# Patient Record
Sex: Female | Born: 1995 | Race: White | Hispanic: No | Marital: Single | Smoking: Never smoker
Health system: Southern US, Community
[De-identification: ages and names within clinical notes are randomized; demographics above are authoritative.]

## PROBLEM LIST (undated history)

## (undated) ENCOUNTER — Inpatient Hospital Stay (HOSPITAL_COMMUNITY): Payer: Self-pay

## (undated) DIAGNOSIS — F329 Major depressive disorder, single episode, unspecified: Secondary | ICD-10-CM

## (undated) DIAGNOSIS — F32A Depression, unspecified: Secondary | ICD-10-CM

## (undated) DIAGNOSIS — N390 Urinary tract infection, site not specified: Secondary | ICD-10-CM

---

## 2013-06-26 ENCOUNTER — Emergency Department (HOSPITAL_COMMUNITY)
Admission: EM | Admit: 2013-06-26 | Discharge: 2013-06-26 | Disposition: A | Discharge status: Home / Self Care | Payer: Managed Care, Other (non HMO) | Attending: Emergency Medicine | Admitting: Emergency Medicine

## 2013-06-26 ENCOUNTER — Encounter (HOSPITAL_COMMUNITY): Payer: Self-pay | Admitting: Nurse Practitioner

## 2013-06-26 ENCOUNTER — Emergency Department (HOSPITAL_COMMUNITY): Payer: Managed Care, Other (non HMO)

## 2013-06-26 DIAGNOSIS — Y9389 Activity, other specified: Secondary | ICD-10-CM | POA: Insufficient documentation

## 2013-06-26 DIAGNOSIS — X500XXA Overexertion from strenuous movement or load, initial encounter: Secondary | ICD-10-CM | POA: Insufficient documentation

## 2013-06-26 DIAGNOSIS — Y929 Unspecified place or not applicable: Secondary | ICD-10-CM | POA: Insufficient documentation

## 2013-06-26 DIAGNOSIS — S91309A Unspecified open wound, unspecified foot, initial encounter: Secondary | ICD-10-CM | POA: Insufficient documentation

## 2013-06-26 DIAGNOSIS — S99922A Unspecified injury of left foot, initial encounter: Secondary | ICD-10-CM

## 2013-06-26 NOTE — ED Provider Notes (Signed)
CSN: [id number]     Arrival date & time 06/26/13  1504 History  This chart was scribed for Kathleen Elders, NP working with Dagmar Hait, MD by Quintella Reichert, ED Scribe. This patient was seen in room TR11C/TR11C and the patient's care was started at 4:17 PM.  Chief Complaint  Patient presents with  . Foot Pain    The history is provided by the patient. No language interpreter was used.   HPI Comments: Kathleen Lloyd is a 17 y.o. female who presents to the Emergency Department complaining of a left ankle injury sustained last night.  Pt states that she was trying to help a friend get up last night and they fell onto her foot and twisted her foot outwards. Pt has sudden onset left foot pain that radiates up to her knee. She states bearing weight and movement worsen the pain. Pt has some numbness and tingling in her foot. She has taken ibuprofen and iced with mild relief. Pt denies any other associated symptoms.   No past medical history on file. No past surgical history on file. No family history on file.  History  Substance Use Topics  . Smoking status: Never Smoker   . Smokeless tobacco: Not on file  . Alcohol Use: No    OB History   Grav Para Term Preterm Abortions TAB SAB Ect Mult Living                  Review of Systems  Musculoskeletal: Positive for arthralgias.  Neurological: Positive for numbness.  All other systems reviewed and are negative.    Allergies  Review of patient's allergies indicates no known allergies.  Home Medications  No current outpatient prescriptions on file.  BP 118/71  Pulse 92  Temp(Src) 99 F (37.2 C) (Oral)  Resp 18  SpO2 100%  Physical Exam  Nursing note and vitals reviewed. Constitutional: She is oriented to person, place, and time. She appears well-developed and well-nourished. No distress.  HENT:  Head: Normocephalic and atraumatic.  Eyes: EOM are normal.  Neck: Neck supple. No tracheal deviation present.   Cardiovascular: Normal rate.   Capillary refill less than 3 seconds.  Pulmonary/Chest: Effort normal. No respiratory distress.  Musculoskeletal: Normal range of motion.  Tenderness to left medial malleolus that extends down into her foot. No deformity noted. No bruising noted.   Neurological: She is alert and oriented to person, place, and time.  Sensation intact.   Skin: Skin is warm and dry.  Psychiatric: She has a normal mood and affect. Her behavior is normal.    ED Course  Procedures (including critical care time)  DIAGNOSTIC STUDIES: Oxygen Saturation is 100% on room air, normal by my interpretation.    COORDINATION OF CARE: 4:20 PM-Discussed treatment plan which includes xray with pt at bedside and pt agreed to plan.   4:50 PM-Will give pt an ankle splint and advised her continue taking ibuprofen.   Labs Review Labs Reviewed - No data to display  Imaging Review Dg Foot Complete Left  06/26/2013   CLINICAL DATA:  Traumatic injury with pain  EXAM: LEFT FOOT - COMPLETE 3+ VIEW  COMPARISON:  None.  FINDINGS: There is no evidence of fracture or dislocation. There is no evidence of arthropathy or other focal bone abnormality. Soft tissues are unremarkable.  IMPRESSION: No acute abnormality noted.   Electronically Signed   By: Alcide Clever M.D.   On: 06/26/2013 16:33    MDM   1. Soft tissue  injury of foot, left, initial encounter     X-ray; no evidence of fracture or dislocation.  Soft tissue injury vs. Mild sprain. ASO splint for support. Ibuprofen 600-800mg  every 6 hours.   I personally performed the services described in this documentation, which was scribed in my presence. The recorded information has been reviewed and is accurate.       Kathleen Elders, NP 06/26/13 [phone]

## 2013-06-26 NOTE — ED Provider Notes (Signed)
Medical screening examination/treatment/procedure(s) were performed by non-physician practitioner and as supervising physician I was immediately available for consultation/collaboration.   Dagmar Hait, MD 06/26/13 [phone]

## 2013-06-26 NOTE — ED Notes (Signed)
Pt was trying to help friend get up last night and they fell onto her foot. C/o L foot pain since, from ankle radiating towards knee. Ambulatory.

## 2013-09-22 NOTE — L&D Delivery Note (Signed)
Delivery Note At 2:20 PM a viable female, "Herschel [place]", was delivered via Vaginal, Spontaneous Delivery (Presentation: Left Occiput Anterior).  APGAR: 5, 8; weight 7 lb 1.8 oz (3225 g).   Placenta status: Intact, Spontaneous.  Cord: 3 vessels with the following complications: .CAN x 2, body cord, occult prolapse.  Cord pH: arterial 7.4, venous 7.4 Depressed at delivery, Code Apgar called.  Baby responded quickly to stimulation and O2.  Cleared by NICU team to remain in room with mother.  Anesthesia: Epidural  Episiotomy: None Lacerations: Bilateral periurethral Suture Repair: 2.0 3.0 vicryl Est. Blood Loss (mL): 250 cc  Mom to postpartum.  Baby to Couplet care / Skin to Skin. Family plans outpatient circumcision.  Tarsha Blando, [place] 06/11/2014, 3:10 PM

## 2013-10-11 LAB — OB RESULTS CONSOLE HEPATITIS B SURFACE ANTIGEN: Hepatitis B Surface Ag: NEGATIVE

## 2013-10-11 LAB — OB RESULTS CONSOLE HIV ANTIBODY (ROUTINE TESTING): HIV: NONREACTIVE

## 2013-10-11 LAB — OB RESULTS CONSOLE ABO/RH: RH Type: POSITIVE

## 2013-10-11 LAB — OB RESULTS CONSOLE GC/CHLAMYDIA
CHLAMYDIA, DNA PROBE: NEGATIVE
GC PROBE AMP, GENITAL: NEGATIVE

## 2013-10-11 LAB — OB RESULTS CONSOLE RUBELLA ANTIBODY, IGM: RUBELLA: IMMUNE

## 2013-10-11 LAB — OB RESULTS CONSOLE ANTIBODY SCREEN: Antibody Screen: NEGATIVE

## 2013-10-11 LAB — OB RESULTS CONSOLE RPR: RPR: NONREACTIVE

## 2013-11-18 ENCOUNTER — Encounter (HOSPITAL_COMMUNITY): Payer: Self-pay | Admitting: Emergency Medicine

## 2013-11-18 ENCOUNTER — Emergency Department (HOSPITAL_COMMUNITY)
Admission: EM | Admit: 2013-11-18 | Discharge: 2013-11-18 | Discharge status: Against Medical Advice | Payer: Medicaid Other | Attending: Emergency Medicine | Admitting: Emergency Medicine

## 2013-11-18 DIAGNOSIS — K920 Hematemesis: Secondary | ICD-10-CM | POA: Insufficient documentation

## 2013-11-18 DIAGNOSIS — O9989 Other specified diseases and conditions complicating pregnancy, childbirth and the puerperium: Secondary | ICD-10-CM | POA: Insufficient documentation

## 2013-11-18 DIAGNOSIS — K921 Melena: Secondary | ICD-10-CM | POA: Insufficient documentation

## 2013-11-18 NOTE — ED Notes (Addendum)
Pt reports she is [redacted] weeks pregnant and has been throwing up blood and bile all day today. Unable to hold any fluids down. Denies abdominal pain. Reports bright red and dark red blood in commode.  Endorses throwing up a lot over the last few weeks, taking promethazine 25 mg without relief, not currently vomiting at this time.

## 2013-11-18 NOTE — ED Notes (Signed)
No answer when called 3 times.  

## 2014-01-24 ENCOUNTER — Encounter (HOSPITAL_COMMUNITY): Payer: Self-pay | Admitting: *Deleted

## 2014-01-24 ENCOUNTER — Inpatient Hospital Stay (HOSPITAL_COMMUNITY)
Admission: AD | Admit: 2014-01-24 | Discharge: 2014-01-24 | Disposition: A | Discharge status: Home / Self Care | Payer: Medicaid Other | Source: Ambulatory Visit | Attending: Obstetrics & Gynecology | Admitting: Obstetrics & Gynecology

## 2014-01-24 DIAGNOSIS — O99619 Diseases of the digestive system complicating pregnancy, unspecified trimester: Secondary | ICD-10-CM

## 2014-01-24 DIAGNOSIS — R0789 Other chest pain: Secondary | ICD-10-CM | POA: Insufficient documentation

## 2014-01-24 DIAGNOSIS — B3731 Acute candidiasis of vulva and vagina: Secondary | ICD-10-CM

## 2014-01-24 DIAGNOSIS — K219 Gastro-esophageal reflux disease without esophagitis: Secondary | ICD-10-CM

## 2014-01-24 DIAGNOSIS — R0602 Shortness of breath: Secondary | ICD-10-CM | POA: Insufficient documentation

## 2014-01-24 DIAGNOSIS — B373 Candidiasis of vulva and vagina: Secondary | ICD-10-CM

## 2014-01-24 DIAGNOSIS — O99891 Other specified diseases and conditions complicating pregnancy: Secondary | ICD-10-CM | POA: Insufficient documentation

## 2014-01-24 DIAGNOSIS — O9989 Other specified diseases and conditions complicating pregnancy, childbirth and the puerperium: Principal | ICD-10-CM

## 2014-01-24 HISTORY — DX: Urinary tract infection, site not specified: N39.0

## 2014-01-24 LAB — URINE MICROSCOPIC-ADD ON

## 2014-01-24 LAB — URINALYSIS, ROUTINE W REFLEX MICROSCOPIC
Bilirubin Urine: NEGATIVE
GLUCOSE, UA: NEGATIVE mg/dL
Hgb urine dipstick: NEGATIVE
KETONES UR: NEGATIVE mg/dL
Nitrite: NEGATIVE
PH: 6 (ref 5.0–8.0)
Protein, ur: NEGATIVE mg/dL
SPECIFIC GRAVITY, URINE: 1.015 (ref 1.005–1.030)
Urobilinogen, UA: 1 mg/dL (ref 0.0–1.0)

## 2014-01-24 LAB — WET PREP, GENITAL
CLUE CELLS WET PREP: NONE SEEN
TRICH WET PREP: NONE SEEN

## 2014-01-24 MED ORDER — FLUCONAZOLE 150 MG PO TABS
150.0000 mg | ORAL_TABLET | Freq: Once | ORAL | Status: AC
  Administered 2014-01-24: 150 mg via ORAL
  Filled 2014-01-24: qty 1

## 2014-01-24 MED ORDER — FAMOTIDINE 10 MG PO TABS: 10.0000 mg | ORAL_TABLET | Freq: Two times a day (BID) | ORAL | Status: DC

## 2014-01-24 MED ORDER — FLUCONAZOLE 150 MG PO TABS: 150.0000 mg | ORAL_TABLET | Freq: Once | ORAL | Status: DC

## 2014-01-24 MED ORDER — GI COCKTAIL ~~LOC~~
30.0000 mL | Freq: Once | ORAL | Status: AC
  Administered 2014-01-24: 30 mL via ORAL
  Filled 2014-01-24: qty 30

## 2014-01-24 NOTE — Progress Notes (Signed)
Release of information signed to get medical records from Dr. Cyndie Chime office. Given to L. Ronnald Ramp, NS to call and fax.

## 2014-01-24 NOTE — Discharge Instructions (Signed)
- We believe that your chest pain and shortness of breath was caused by your untreated Acid Reflux, as your symptoms responded to the GI Cocktail that you drank. Your EKG was normal. - Sent prescription for Pepcid 10mg  take twice daily with meals to your pharmacy. Continue taking this for the next 2 weeks. Discuss this at your first clinic appointment, they may change this at that time - Review the hand out and recommendations to reduce your symptoms - Your swab exam was consistent with a Yeast Infection, you received Diflucan 150mg  tablet for one dose here, and sent prescription for another identical dose (to be taken Friday 5/8, if your symptoms persist) - Your urine was concerning for a potential UTI as well. We have sent it for a culture, and will notify you with the results if we need to call in another antibiotic that will specifically treat this infection. Otherwise, check with the clinic about your results on this test.  - If you develop return of worsening chest pain / pressure, shortness of breath, HA that doesn't resolve, please seek immediate care by calling the clinic and coming back to the MAU for further evaluation.  Heartburn During Pregnancy  Heartburn is a burning sensation in the chest caused by stomach acid backing up into the esophagus. Heartburn is common in pregnancy because a certain hormone (progesterone) is released when a woman is pregnant. The progesterone hormone may relax the valve that separates the esophagus from the stomach. This allows acid to go up into the esophagus, causing heartburn. Heartburn may also happen in pregnancy because the enlarging uterus pushes up on the stomach, which pushes more acid into the esophagus. This is especially true in the later stages of pregnancy. Heartburn problems usually go away after giving birth. CAUSES  Heartburn is caused by stomach acid backing up into the esophagus. During pregnancy, this may result from various things, including:     The progesterone hormone.  Changing hormone levels.  The growing uterus pushing stomach acid upward.  Large meals.  Certain foods and drinks.  Exercise.  Increased acid production. SIGNS AND SYMPTOMS   Burning pain in the chest or lower throat.  Bitter taste in the mouth.  Coughing. DIAGNOSIS  Your health care provider will typically diagnose heartburn by taking a careful history of your concern. Blood tests may be done to check for a certain type of bacteria that is associated with heartburn. Sometimes, heartburn is diagnosed by prescribing a heartburn medicine to see if the symptoms improve. In some cases, a procedure called an endoscopy may be done. In this procedure, a tube with a light and a camera on the end (endoscope) is used to examine the esophagus and the stomach. TREATMENT  Treatment will vary depending on the severity of your symptoms. Your health care provider may recommend:  Over-the-counter medicines (antacids, acid reducers) for mild heartburn.  Prescription medicines to decrease stomach acid or to protect your stomach lining.  Certain changes in your diet.  Elevating the head of your bed by putting blocks under the legs. This helps prevent stomach acid from backing up into the esophagus when you are lying down. HOME CARE INSTRUCTIONS   Only take over-the-counter or prescription medicines as directed by your health care provider.  Raise the head of your bed by putting blocks under the legs if instructed to do so by your health care provider. Sleeping with more pillows is not effective because it only changes the position of your head.  Do not exercise right after eating.  Avoid eating 2 3 hours before bed. Do not lie down right after eating.  Eat small meals throughout the day instead of three large meals.  Identify foods and beverages that make your symptoms worse and avoid them. Foods you may want to avoid  include:  Peppers.  Chocolate.  High-fat foods, including fried foods.  Spicy foods.  Garlic and onions.  Citrus fruits, including oranges, grapefruit, lemons, and limes.  Food containing tomatoes or tomato products.  Mint.  Carbonated and caffeinated drinks.  Vinegar. SEEK MEDICAL CARE IF:  You have abdominal pain of any kind.  You feel burning in your upper abdomen or chest, especially after eating or lying down.  You have nausea and vomiting.  Your stomach feels upset after you eat. SEEK IMMEDIATE MEDICAL CARE IF:   You have severe chest pain that goes down your arm or into your jaw or neck.  You feel sweaty, dizzy, or lightheaded.  You become short of breath.  You vomit blood.  You have difficulty or pain with swallowing.  You have bloody or black, tarry stools.  You have episodes of heartburn more than 3 times a week, for more than 2 weeks. MAKE SURE YOU:  Understand these instructions.  Will watch your condition.  Will get help right away if you are not doing well or get worse. Document Released: 09/05/2000 Document Revised: 06/29/2013 Document Reviewed: 04/27/2013 [facility] Patient Information 2014 [place].   Second Trimester of Pregnancy The second trimester is from week 13 through week 28, months 4 through 6. The second trimester is often a time when you feel your best. Your body has also adjusted to being pregnant, and you begin to feel better physically. Usually, morning sickness has lessened or quit completely, you may have more energy, and you may have an increase in appetite. The second trimester is also a time when the fetus is growing rapidly. At the end of the sixth month, the fetus is about 9 inches long and weighs about 1 pounds. You will likely begin to feel the baby move (quickening) between 18 and 20 weeks of the pregnancy. BODY CHANGES Your body goes through many changes during pregnancy. The changes vary from woman to woman.    Your weight will continue to increase. You will notice your lower abdomen bulging out.  You may begin to get stretch marks on your hips, abdomen, and breasts.  You may develop headaches that can be relieved by medicines approved by your caregiver.  You may urinate more often because the fetus is pressing on your bladder.  You may develop or continue to have heartburn as a result of your pregnancy.  You may develop constipation because certain hormones are causing the muscles that push waste through your intestines to slow down.  You may develop hemorrhoids or swollen, bulging veins (varicose veins).  You may have back pain because of the weight gain and pregnancy hormones relaxing your joints between the bones in your pelvis and as a result of a shift in weight and the muscles that support your balance.  Your breasts will continue to grow and be tender.  Your gums may bleed and may be sensitive to brushing and flossing.  Dark spots or blotches (chloasma, mask of pregnancy) may develop on your face. This will likely fade after the baby is born.  A dark line from your belly button to the pubic area (linea nigra) may appear. This will likely fade  after the baby is born. WHAT TO EXPECT AT YOUR PRENATAL VISITS During a routine prenatal visit:  You will be weighed to make sure you and the fetus are growing normally.  Your blood pressure will be taken.  Your abdomen will be measured to track your baby's growth.  The fetal heartbeat will be listened to.  Any test results from the previous visit will be discussed. Your caregiver may ask you:  How you are feeling.  If you are feeling the baby move.  If you have had any abnormal symptoms, such as leaking fluid, bleeding, severe headaches, or abdominal cramping.  If you have any questions. Other tests that may be performed during your second trimester include:  Blood tests that check for:  Low iron levels  (anemia).  Gestational diabetes (between 24 and 28 weeks).  Rh antibodies.  Urine tests to check for infections, diabetes, or protein in the urine.  An ultrasound to confirm the proper growth and development of the baby.  An amniocentesis to check for possible genetic problems.  Fetal screens for spina bifida and Down syndrome. HOME CARE INSTRUCTIONS   Avoid all smoking, herbs, alcohol, and unprescribed drugs. These chemicals affect the formation and growth of the baby.  Follow your caregiver's instructions regarding medicine use. There are medicines that are either safe or unsafe to take during pregnancy.  Exercise only as directed by your caregiver. Experiencing uterine cramps is a good sign to stop exercising.  Continue to eat regular, healthy meals.  Wear a good support bra for breast tenderness.  Do not use hot tubs, steam rooms, or saunas.  Wear your seat belt at all times when driving.  Avoid raw meat, uncooked cheese, cat litter boxes, and soil used by cats. These carry germs that can cause birth defects in the baby.  Take your prenatal vitamins.  Try taking a stool softener (if your caregiver approves) if you develop constipation. Eat more high-fiber foods, such as fresh vegetables or fruit and whole grains. Drink plenty of fluids to keep your urine clear or pale yellow.  Take warm sitz baths to soothe any pain or discomfort caused by hemorrhoids. Use hemorrhoid cream if your caregiver approves.  If you develop varicose veins, wear support hose. Elevate your feet for 15 minutes, 3 4 times a day. Limit salt in your diet.  Avoid heavy lifting, wear low heel shoes, and practice good posture.  Rest with your legs elevated if you have leg cramps or low back pain.  Visit your dentist if you have not gone yet during your pregnancy. Use a soft toothbrush to brush your teeth and be gentle when you floss.  A sexual relationship may be continued unless your caregiver  directs you otherwise.  Continue to go to all your prenatal visits as directed by your caregiver. SEEK MEDICAL CARE IF:   You have dizziness.  You have mild pelvic cramps, pelvic pressure, or nagging pain in the abdominal area.  You have persistent nausea, vomiting, or diarrhea.  You have a bad smelling vaginal discharge.  You have pain with urination. SEEK IMMEDIATE MEDICAL CARE IF:   You have a fever.  You are leaking fluid from your vagina.  You have spotting or bleeding from your vagina.  You have severe abdominal cramping or pain.  You have rapid weight gain or loss.  You have shortness of breath with chest pain.  You notice sudden or extreme swelling of your face, hands, ankles, feet, or legs.  You  have not felt your baby move in over an hour.  You have severe headaches that do not go away with medicine.  You have vision changes. Document Released: 09/02/2001 Document Revised: 05/11/2013 Document Reviewed: 11/09/2012 [facility] Patient Information 2014 [place].

## 2014-01-24 NOTE — MAU Provider Note (Signed)
Chief Complaint:  Shortness of Breath  @MAUPATCONTACT @  HPI: Kathleen Lloyd is a 18 y.o. G1P0 at [redacted]w[redacted]d who presents to maternity admissions with CP and SOB.  Reports that she experienced shortness of breath around 1100 yesterday, sudden onset while sitting on couch resting, admits to some worsening today, SOB still persistent without relief, "feels like she can't catch her breath", worse with walking and speech, needs to take frequent breaks, nothing improves it. Also, admits to central chest pressure when woke up, "feels like someone sitting on her chest", denies any radiation of pain. No similar hx of symptoms previously.  Reports some frustration with persistent burning on urination, she states that she has completed x 2 antibiotic courses in the past month (Keflex, and most recently Macrobid x 7 days completed last Saturday). She feels like she is not getting any better on these medicines.  PMH: Anxiety with panic attack in October 2014. States that current episode is not similar.  Admits left frontal HA, dysuria, vaginal itching and dishcarge, +epigastric abd pain. Denies contractions, leakage of fluid or vaginal bleeding. Good fetal movement. Denies fever/chills, cough, nausea / vomiting  Social Hx: Denies any EtOH, tobacco, or drug use.  Pregnancy Course:  PNC at [facility]. No reported complications. Considering transfer of care to different OB.   Past Medical History: Past Medical History  Diagnosis Date  . UTI (lower urinary tract infection)   . Anxiety     never treated    Past obstetric history: OB History  Gravida Para Term Preterm AB SAB TAB Ectopic Multiple Living  1         0    # Outcome Date GA Lbr Len/2nd Weight Sex Delivery Anes PTL Lv  1 CUR               Past Surgical History: History reviewed. No pertinent past surgical history.  Family History: History reviewed. No pertinent family history.  Social History: History  Substance Use Topics  .  Smoking status: Never Smoker   . Smokeless tobacco: Not on file  . Alcohol Use: No    Allergies: No Known Allergies  Meds:  Prescriptions prior to admission  Medication Sig Dispense Refill  . metroNIDAZOLE (METROGEL) 0.75 % vaginal gel Place 1 Applicatorful vaginally at bedtime.      . Olopatadine HCl (PATADAY) 0.2 % SOLN Apply 1 drop to eye every morning.      . Prenatal Vit-Fe Fumarate-FA (PRENATAL MULTIVITAMIN) TABS tablet Take 1 tablet by mouth daily at 12 noon.        ROS: Pertinent findings in history of present illness.  Physical Exam  Blood pressure 105/62, pulse 87, temperature 98.4 F (36.9 C), temperature source Oral, resp. rate 18, height 5' 1.5" (1.562 m), weight 65.318 kg (144 lb), last menstrual period 08/28/2013, SpO2 100.00%. GENERAL: pleasant, well-appearing, mostly comfortable, NAD HEENT: NCAT, PERRL, EOMI, MMM CHEST: +Reproducible CP on palpation sternal and L-chest HEART: RRR, no murmurs heard RESP: CTAB, with some difficulty on full inspiration due to pain. No wheezing or crackles heard. ABDOMEN: Soft, +epigastric tenderness, gravid appropriate for gestational age EXTREMITIES: Nontender, no edema, no erythema, +2 peripheral pulses NEURO: alert and oriented SPECULUM EXAM: NEFG, increased thick white-curd-like discharge, no blood, cervix normal appearing without lesions. Bi-manual with mild cervical tenderness, no adnexal masses   Contractions: None   Labs: No results found for this or any previous visit (from the past 24 hour(s)).  Imaging:  No results found. MAU Course:  Assessment: No diagnosis found. Kathleen Lloyd is a 18 y.o. G1P0 at [redacted]w[redacted]d by LMP presents to MAU for evaluation of SOB and CP (central / Left pressure,+reproducible, worse with deep breath, non-radiating), symptoms worse/unchanged since sudden onset 24 hours ago, concerning for cardiac etiology (highly unlikely ACS without significant PMH), plan for initial work-up with EKG. Of note hx  anxiety with prior panic attack (06/2013), however feels this is not similar. Also, work-up for dysuria in setting of previously treated UTI, plan for UA and wet prep. Currently still c/o SOB, CP, vitals stable and NAD.  UPDATE 1400 - Reviewed EKG (my interpretation is normal EKG with normal axis, HR 75, regular with P waves, no evidence of ST elevation, only significant for non-specific T wave flattening V2-V3)  UPDATE 1515 - Patient with unchanged chest pressure and SOB. Given GI cocktail, collected urine sample sent for UA. Performed Pelvic exam with notable inc thick white-curd like discharge, collected wet prep sample. Bi-manual mostly unremarkable except mild cervical tenderness.  UPDATE 1525 - Improved with resolved CP following GI cocktail. Suspect untreated GERD secondary to pregnancy likely etiology of CP associated with +epigastric tenderness.  Plan: - UA (neg nit, mod leuks, many bact, many squam, +yeast), wet prep (+many yeast, no trich, no BV) - Ordered urine culture (hold off on empiric antibiotics for potential UTI given recent treatment with Keflex and Macrobid < 1 week ago), patient to be notified of culture results regarding starting abx, to follow-up in clinic - Diflucan 150mg  PO x 1 dose, sent rx for repeat 150mg  PO dose to take on 01/27/14 if persistent symptoms - Start Pepcid 10mg  BID x 2 weeks, will reassess symptoms in clinic and titrate accordingly - Discharge to home, with return precautions (return of severe persistent CP, SOB, HA, vision changes, vaginal bleeding) - Discussed plans to transfer care to [facility] clinic, will have pt sign for release of records, sent message to clinic to arrange follow-up initial prenatal appointment and coordination of [place]      Medication List    ASK your doctor about these medications       metroNIDAZOLE 0.75 % vaginal gel  Commonly known as:  METROGEL  Place 1 Applicatorful vaginally at bedtime.     PATADAY 0.2 % Soln  Generic  drug:  Olopatadine HCl  Apply 1 drop to eye every morning.     prenatal multivitamin Tabs tablet  Take 1 tablet by mouth daily at 12 noon.        Nobie Putnam, [place], PGY-1 01/24/2014 1:42 PM

## 2014-01-24 NOTE — MAU Provider Note (Signed)
I examined pt and agree with documentation above and resident plan of care. Walidah N Muhammad, CNM  

## 2014-01-24 NOTE — MAU Note (Signed)
Was short of breath yesterday, called Dr and he felt she did not need to be seen.  However today she feels short of breath and has pressure on her chest.  Also having burning and itching.  Dr keeps treating her for yeast, UTI and BV

## 2014-01-25 LAB — URINE CULTURE
Colony Count: NO GROWTH
Culture: NO GROWTH

## 2014-02-21 ENCOUNTER — Encounter (HOSPITAL_COMMUNITY): Payer: Self-pay | Admitting: *Deleted

## 2014-02-21 ENCOUNTER — Inpatient Hospital Stay (HOSPITAL_COMMUNITY)
Admission: AD | Admit: 2014-02-21 | Discharge: 2014-02-21 | Disposition: A | Discharge status: Home / Self Care | Payer: Medicaid Other | Source: Ambulatory Visit | Attending: Obstetrics and Gynecology | Admitting: Obstetrics and Gynecology

## 2014-02-21 DIAGNOSIS — R51 Headache: Secondary | ICD-10-CM | POA: Insufficient documentation

## 2014-02-21 DIAGNOSIS — O212 Late vomiting of pregnancy: Secondary | ICD-10-CM | POA: Insufficient documentation

## 2014-02-21 LAB — URINE MICROSCOPIC-ADD ON

## 2014-02-21 LAB — COMPREHENSIVE METABOLIC PANEL
ALT: 13 U/L (ref 0–35)
AST: 23 U/L (ref 0–37)
Albumin: 2.9 g/dL — ABNORMAL LOW (ref 3.5–5.2)
Alkaline Phosphatase: 59 U/L (ref 39–117)
BILIRUBIN TOTAL: 0.8 mg/dL (ref 0.3–1.2)
BUN: 7 mg/dL (ref 6–23)
CO2: 23 mEq/L (ref 19–32)
CREATININE: 0.45 mg/dL — AB (ref 0.50–1.10)
Calcium: 8.7 mg/dL (ref 8.4–10.5)
Chloride: 103 mEq/L (ref 96–112)
GFR calc Af Amer: >90 mL/min (ref >90)
GFR calc non Af Amer: >90 mL/min (ref >90)
Glucose, Bld: 109 mg/dL — ABNORMAL HIGH (ref 70–99)
POTASSIUM: 3.7 meq/L (ref 3.7–5.3)
Sodium: 137 mEq/L (ref 137–147)
Total Protein: 5.8 g/dL — ABNORMAL LOW (ref 6.0–8.3)

## 2014-02-21 LAB — URINALYSIS, ROUTINE W REFLEX MICROSCOPIC
Bilirubin Urine: NEGATIVE
GLUCOSE, UA: NEGATIVE mg/dL
Hgb urine dipstick: NEGATIVE
Ketones, ur: 15 mg/dL — AB
Nitrite: NEGATIVE
PH: 6.5 (ref 5.0–8.0)
Protein, ur: NEGATIVE mg/dL
SPECIFIC GRAVITY, URINE: 1.02 (ref 1.005–1.030)
Urobilinogen, UA: 1 mg/dL (ref 0.0–1.0)

## 2014-02-21 LAB — CBC
HCT: 33.7 % — ABNORMAL LOW (ref 36.0–46.0)
Hemoglobin: 11.2 g/dL — ABNORMAL LOW (ref 12.0–15.0)
MCH: 31.5 pg (ref 26.0–34.0)
MCHC: 33.2 g/dL (ref 30.0–36.0)
MCV: 94.9 fL (ref 78.0–100.0)
PLATELETS: 152 10*3/uL (ref 150–400)
RBC: 3.55 MIL/uL — ABNORMAL LOW (ref 3.87–5.11)
RDW: 13.9 % (ref 11.5–15.5)
WBC: 8.2 10*3/uL (ref 4.0–10.5)

## 2014-02-21 MED ORDER — IBUPROFEN 400 MG PO TABS: 400.0000 mg | ORAL_TABLET | Freq: Once | ORAL | Status: DC

## 2014-02-21 MED ORDER — LACTATED RINGERS IV BOLUS (SEPSIS)
1000.0000 mL | Freq: Once | INTRAVENOUS | Status: AC
Start: 2014-02-21 — End: 2014-02-21
  Administered 2014-02-21: 1000 mL via INTRAVENOUS

## 2014-02-21 MED ORDER — IBUPROFEN 600 MG PO TABS
600.0000 mg | ORAL_TABLET | Freq: Once | ORAL | Status: AC
  Administered 2014-02-21: 600 mg via ORAL
  Filled 2014-02-21: qty 1

## 2014-02-21 MED ORDER — ONDANSETRON HCL 4 MG/2ML IJ SOLN
4.0000 mg | Freq: Once | INTRAMUSCULAR | Status: AC
  Administered 2014-02-21: 4 mg via INTRAVENOUS
  Filled 2014-02-21: qty 2

## 2014-02-21 NOTE — MAU Provider Note (Signed)
History  18 yo G1P0 @ 25.2 wks presented to MAU w/ c/o vomiting (blood-tinged) x 1 this morning, pounding left sided h/a, feeling hot, and a tender knot under her right armpit.   Has only had pancakes today, which she vomited, and a cabbage roll at 4 pm yesterday. States also took a Phenergan tab today and vomited that as well.  Reports +FM. Denies ctxs, VB, LOF, fever, diarrhea or sick contact.  Gives h/o staph infection 2 yrs ago to right armpit and states that is all she can remember about it.    There are no active problems to display for this patient.   Chief Complaint  Patient presents with  . Hematemesis  . Headache   HPI  As above  OB History   Grav Para Term Preterm Abortions TAB SAB Ect Mult Living   1         0      Past Medical History  Diagnosis Date  . UTI (lower urinary tract infection)   . Anxiety     never treated    History reviewed. No pertinent past surgical history.  History reviewed. No pertinent family history.  History  Substance Use Topics  . Smoking status: Never Smoker   . Smokeless tobacco: Not on file  . Alcohol Use: No    Allergies: No Known Allergies  No prescriptions prior to admission    ROS  Vomiting Feeling hot +FM Headache Swollen/painful area to right armpit  Physical Exam   Blood pressure 95/57, pulse 66, temperature 98.2 F (36.8 C), temperature source Oral, resp. rate 16, height 5' 1.5" (1.562 m), weight 145 lb (65.772 kg), last menstrual period 08/28/2013, SpO2 100.00%.  Results for orders placed during the hospital encounter of 02/21/14 (from the past 24 hour(s))  URINALYSIS, ROUTINE W REFLEX MICROSCOPIC     Status: Abnormal   Collection Time    02/21/14 12:41 PM      Result Value Ref Range   Color, Urine YELLOW  YELLOW   APPearance HAZY (*) CLEAR   Specific Gravity, Urine 1.020  1.005 - 1.030   pH 6.5  5.0 - 8.0   Glucose, UA NEGATIVE  NEGATIVE mg/dL   Hgb urine dipstick NEGATIVE  NEGATIVE   Bilirubin Urine NEGATIVE  NEGATIVE   Ketones, ur 15 (*) NEGATIVE mg/dL   Protein, ur NEGATIVE  NEGATIVE mg/dL   Urobilinogen, UA 1.0  0.0 - 1.0 mg/dL   Nitrite NEGATIVE  NEGATIVE   Leukocytes, UA SMALL (*) NEGATIVE  URINE MICROSCOPIC-ADD ON     Status: Abnormal   Collection Time    02/21/14 12:41 PM      Result Value Ref Range   Squamous Epithelial / LPF MANY (*) RARE   WBC, UA 3-6  <3 WBC/hpf   Urine-Other MUCOUS PRESENT    CBC     Status: Abnormal   Collection Time    02/21/14  2:05 PM      Result Value Ref Range   WBC 8.2  4.0 - 10.5 K/uL   RBC 3.55 (*) 3.87 - 5.11 MIL/uL   Hemoglobin 11.2 (*) 12.0 - 15.0 g/dL   HCT 33.7 (*) 36.0 - 46.0 %   MCV 94.9  78.0 - 100.0 fL   MCH 31.5  26.0 - 34.0 pg   MCHC 33.2  30.0 - 36.0 g/dL   RDW 13.9  11.5 - 15.5 %   Platelets 152  150 - 400 K/uL  COMPREHENSIVE METABOLIC PANEL  Status: Abnormal   Collection Time    02/21/14  2:05 PM      Result Value Ref Range   Sodium 137  137 - 147 mEq/L   Potassium 3.7  3.7 - 5.3 mEq/L   Chloride 103  96 - 112 mEq/L   CO2 23  19 - 32 mEq/L   Glucose, Bld 109 (*) 70 - 99 mg/dL   BUN 7  6 - 23 mg/dL   Creatinine, Ser 0.45 (*) 0.50 - 1.10 mg/dL   Calcium 8.7  8.4 - 10.5 mg/dL   Total Protein 5.8 (*) 6.0 - 8.3 g/dL   Albumin 2.9 (*) 3.5 - 5.2 g/dL   AST 23  0 - 37 U/L   ALT 13  0 - 35 U/L   Alkaline Phosphatase 59  39 - 117 U/L   Total Bilirubin 0.8  0.3 - 1.2 mg/dL   GFR calc non Af Amer >90  >90 mL/min   GFR calc Af Amer >90  >90 mL/min    Physical Exam  Gen: NAD Plum sized, fluid filled area noted to right armpit - tender to palpation; no erythema. Left armpit normal FHRT: Reassuring. Appropriate for this gestational age 30: No ctxs  ED Course  Assessment: R/o boil Vomiting (resolved)-no episodes of vomitting during visit. Able to tolerate po intake  Headache (resolved)  Plan: Consulted w/ Dr. Cletis Media. General Surgery consult. BRAT diet, adv as tolerated. Hydrate. Expressed  relief w/ IVFs, IV Zofran and Ibuprofen. Keep next OB appt.   Farrel Gordon, CNM, MS 02/21/14@ 04:05 PM

## 2014-02-21 NOTE — MAU Note (Signed)
Woke up today, not feeling good. Head was pounding, started throwing up, then started puking blood.

## 2014-02-21 NOTE — Discharge Instructions (Signed)
Lymphadenopathy Lymphadenopathy means "disease of the lymph glands." But the term is usually used to describe swollen or enlarged lymph glands, also called lymph nodes. These are the bean-shaped organs found in many locations including the neck, underarm, and groin. Lymph glands are part of the immune system, which fights infections in your body. Lymphadenopathy can occur in just one area of the body, such as the neck, or it can be generalized, with lymph node enlargement in several areas. The nodes found in the neck are the most common sites of lymphadenopathy. CAUSES  When your immune system responds to germs (such as viruses or bacteria ), infection-fighting cells and fluid build up. This causes the glands to grow in size. This is usually not something to worry about. Sometimes, the glands themselves can become infected and inflamed. This is called lymphadenitis. Enlarged lymph nodes can be caused by many diseases: Bacterial disease, such as strep throat or a skin infection. Viral disease, such as a common cold. Other germs, such as lyme disease, tuberculosis, or sexually transmitted diseases. Cancers, such as lymphoma (cancer of the lymphatic system) or leukemia (cancer of the white blood cells). Inflammatory diseases such as lupus or rheumatoid arthritis. Reactions to medications. Many of the diseases above are rare, but important. This is why you should see your caregiver if you have lymphadenopathy. SYMPTOMS  Swollen, enlarged lumps in the neck, back of the head or other locations. Tenderness. Warmth or redness of the skin over the lymph nodes. Fever. DIAGNOSIS  Enlarged lymph nodes are often near the source of infection. They can help healthcare providers diagnose your illness. For instance:  Swollen lymph nodes around the jaw might be caused by an infection in the mouth. Enlarged glands in the neck often signal a throat infection. Lymph nodes that are swollen in more than one area often  indicate an illness caused by a virus. Your caregiver most likely will know what is causing your lymphadenopathy after listening to your history and examining you. Blood tests, x-rays or other tests may be needed. If the cause of the enlarged lymph node cannot be found, and it does not go away by itself, then a biopsy may be needed. Your caregiver will discuss this with you. TREATMENT  Treatment for your enlarged lymph nodes will depend on the cause. Many times the nodes will shrink to normal size by themselves, with no treatment. Antibiotics or other medicines may be needed for infection. Only take over-the-counter or prescription medicines for pain, discomfort or fever as directed by your caregiver. HOME CARE INSTRUCTIONS  Swollen lymph glands usually return to normal when the underlying medical condition goes away. If they persist, contact your health-care provider. He/she might prescribe antibiotics or other treatments, depending on the diagnosis. Take any medications exactly as prescribed. Keep any follow-up appointments made to check on the condition of your enlarged nodes.  SEEK MEDICAL CARE IF:  Swelling lasts for more than two weeks. You have symptoms such as weight loss, night sweats, fatigue or fever that does not go away. The lymph nodes are hard, seem fixed to the skin or are growing rapidly. Skin over the lymph nodes is red and inflamed. This could mean there is an infection. SEEK IMMEDIATE MEDICAL CARE IF:  Fluid starts leaking from the area of the enlarged lymph node. You develop a fever of 102 F (38.9 C) or greater. Severe pain develops (not necessarily at the site of a large lymph node). You develop chest pain or shortness of breath.  You develop worsening abdominal pain. MAKE SURE YOU:  Understand these instructions. Will watch your condition. Will get help right away if you are not doing well or get worse. Document Released: 06/17/2008 Document Revised: 12/01/2011 Document  Reviewed: 06/17/2008 [facility] Patient Information 2014 [place]. Nausea and Vomiting Nausea is a sick feeling that often comes before throwing up (vomiting). Vomiting is a reflex where stomach contents come out of your mouth. Vomiting can cause severe loss of body fluids (dehydration). Children and elderly adults can become dehydrated quickly, especially if they also have diarrhea. Nausea and vomiting are symptoms of a condition or disease. It is important to find the cause of your symptoms. CAUSES   Direct irritation of the stomach lining. This irritation can result from increased acid production (gastroesophageal reflux disease), infection, food poisoning, taking certain medicines (such as nonsteroidal anti-inflammatory drugs), alcohol use, or tobacco use.  Signals from the brain.These signals could be caused by a headache, heat exposure, an inner ear disturbance, increased pressure in the brain from injury, infection, a tumor, or a concussion, pain, emotional stimulus, or metabolic problems.  An obstruction in the gastrointestinal tract (bowel obstruction).  Illnesses such as diabetes, hepatitis, gallbladder problems, appendicitis, kidney problems, cancer, sepsis, atypical symptoms of a heart attack, or eating disorders.  Medical treatments such as chemotherapy and radiation.  Receiving medicine that makes you sleep (general anesthetic) during surgery. DIAGNOSIS Your caregiver may ask for tests to be done if the problems do not improve after a few days. Tests may also be done if symptoms are severe or if the reason for the nausea and vomiting is not clear. Tests may include:  Urine tests.  Blood tests.  Stool tests.  Cultures (to look for evidence of infection).  X-rays or other imaging studies. Test results can help your caregiver make decisions about treatment or the need for additional tests. TREATMENT You need to stay well hydrated. Drink frequently but in small  amounts.You may wish to drink water, sports drinks, clear broth, or eat frozen ice pops or gelatin dessert to help stay hydrated.When you eat, eating slowly may help prevent nausea.There are also some antinausea medicines that may help prevent nausea. HOME CARE INSTRUCTIONS   Take all medicine as directed by your caregiver.  If you do not have an appetite, do not force yourself to eat. However, you must continue to drink fluids.  If you have an appetite, eat a normal diet unless your caregiver tells you differently.  Eat a variety of complex carbohydrates (rice, wheat, potatoes, bread), lean meats, yogurt, fruits, and vegetables.  Avoid high-fat foods because they are more difficult to digest.  Drink enough water and fluids to keep your urine clear or pale yellow.  If you are dehydrated, ask your caregiver for specific rehydration instructions. Signs of dehydration may include:  Severe thirst.  Dry lips and mouth.  Dizziness.  Dark urine.  Decreasing urine frequency and amount.  Confusion.  Rapid breathing or pulse. SEEK IMMEDIATE MEDICAL CARE IF:   You have blood or brown flecks (like coffee grounds) in your vomit.  You have black or bloody stools.  You have a severe headache or stiff neck.  You are confused.  You have severe abdominal pain.  You have chest pain or trouble breathing.  You do not urinate at least once every 8 hours.  You develop cold or clammy skin.  You continue to vomit for longer than 24 to 48 hours.  You have a fever. MAKE SURE  YOU:   Understand these instructions.  Will watch your condition.  Will get help right away if you are not doing well or get worse. Document Released: 09/08/2005 Document Revised: 12/01/2011 Document Reviewed: 02/05/2011 [facility] Patient Information 2014 [place], [place].

## 2014-03-01 ENCOUNTER — Encounter: Payer: Managed Care, Other (non HMO) | Admitting: Advanced Practice Midwife

## 2014-03-15 ENCOUNTER — Encounter (INDEPENDENT_AMBULATORY_CARE_PROVIDER_SITE_OTHER): Payer: Self-pay | Admitting: Surgery

## 2014-03-15 ENCOUNTER — Ambulatory Visit (INDEPENDENT_AMBULATORY_CARE_PROVIDER_SITE_OTHER): Payer: Medicaid Other | Admitting: Surgery

## 2014-03-15 ENCOUNTER — Encounter (INDEPENDENT_AMBULATORY_CARE_PROVIDER_SITE_OTHER): Payer: Self-pay

## 2014-03-15 VITALS — BP 96/60 | HR 68 | Temp 98.4°F | Resp 14 | Ht 61.0 in | Wt 148.2 lb

## 2014-03-15 DIAGNOSIS — L03111 Cellulitis of right axilla: Secondary | ICD-10-CM | POA: Insufficient documentation

## 2014-03-15 DIAGNOSIS — Z3492 Encounter for supervision of normal pregnancy, unspecified, second trimester: Secondary | ICD-10-CM | POA: Insufficient documentation

## 2014-03-15 DIAGNOSIS — Z3480 Encounter for supervision of other normal pregnancy, unspecified trimester: Secondary | ICD-10-CM

## 2014-03-15 DIAGNOSIS — IMO0002 Reserved for concepts with insufficient information to code with codable children: Secondary | ICD-10-CM

## 2014-03-15 MED ORDER — CEPHALEXIN 500 MG PO CAPS: 500.0000 mg | ORAL_CAPSULE | Freq: Three times a day (TID) | ORAL | Status: DC

## 2014-03-15 NOTE — Progress Notes (Signed)
Subjective:     Patient ID: Kathleen Lloyd, female   DOB: Jan 21, 1996, 18 y.o.   MRN: [id number]  HPI  Note: Portions of this report may have been transcribed using voice recognition software. Every effort was made to ensure accuracy; however, inadvertent computerized transcription errors may be present.   Any transcriptional errors that result from this process are unintentional.            Kathleen Lloyd  [birth year] [id number]  Patient Care Team: No Pcp Per Patient as PCP - General (General Practice)  This patient is a 18 y.o.female who presents today for surgical evaluation at the request of Farrel Gordon, CNM / Dr Cletis Media.   Reason for visit: Right axillary pain swelling ? infection  Kathleen Lloyd female.  In her second trimester pregnancy.  Followed by [facility].  Noted swelling in her right armpit last month.  Had similar episode 3 years ago.  Thought to be infection.  Improved with oral doxycycline antibiotics.  She had episode of hematemesis earlier this month.  Went to the emergency room at [facility].  Improved.  Noted swelling to physicians there.  Surgical consultation recommended.  She denies any history infections elsewhere.  No MRSA.  No falls or trauma.  No hydradenitis.  Denies any nipple discharge.  Mild sensitivity was pregnancy but not severe.  No history of breast masses.  No history of fall or trauma.  Denies any night sweats.  No fevers chills or sweats.  She comes in today with her significant other  There are no active problems to display for this patient.   Past Medical History  Diagnosis Date  . UTI (lower urinary tract infection)   . Anxiety     never treated    History reviewed. No pertinent past surgical history.  History   Social History  . Marital Status: Single    Spouse Name: N/A    Number of Children: N/A  . Years of Education: N/A   Occupational History  . Not on file.   Social History Main Topics  . Smoking status:  Never Smoker   . Smokeless tobacco: Not on file  . Alcohol Use: No  . Drug Use: No  . Sexual Activity: Yes    Birth Control/ Protection: None   Other Topics Concern  . Not on file   Social History Narrative  . No narrative on file    Family History  Problem Relation Age of Onset  . Cancer Father     melanoma    Current Outpatient Prescriptions  Medication Sig Dispense Refill  . Prenatal Vit-Fe Fumarate-FA (PRENATAL MULTIVITAMIN) TABS tablet Take 1 tablet by mouth daily at 12 noon.       No current facility-administered medications for this visit.     No Known Allergies  BP 96/60  Pulse 68  Temp(Src) 98.4 F (36.9 C) (Temporal)  Resp 14  Ht 5\' 1"  (1.549 m)  Wt 148 lb 3.2 oz (67.223 kg)  BMI 28.02 kg/m2  LMP 08/28/2013  No results found.   Review of Systems  Constitutional: Negative for fever, chills, diaphoresis, appetite change and fatigue.  HENT: Negative for ear discharge, ear pain, sore throat and trouble swallowing.   Eyes: Negative for photophobia, discharge and visual disturbance.  Respiratory: Negative for cough, choking, chest tightness and shortness of breath.   Cardiovascular: Negative for chest pain and palpitations.  Gastrointestinal: Negative for nausea, vomiting, abdominal pain, diarrhea, constipation, anal bleeding and rectal pain.  Endocrine:  Negative for cold intolerance and heat intolerance.  Genitourinary: Negative for dysuria, frequency and difficulty urinating.  Musculoskeletal: Negative for gait problem, myalgias and neck pain.  Skin: Negative for color change, pallor and rash.  Allergic/Immunologic: Negative for environmental allergies, food allergies and immunocompromised state.  Neurological: Negative for dizziness, speech difficulty, weakness and numbness.  Hematological: Negative for adenopathy.  Psychiatric/Behavioral: Negative for confusion and agitation. The patient is not nervous/anxious.        Objective:   Physical Exam    Constitutional: She is oriented to person, place, and time. She appears well-developed and well-nourished. No distress.  HENT:  Head: Normocephalic.  Mouth/Throat: Oropharynx is clear and moist. No oropharyngeal exudate.  Eyes: Conjunctivae and EOM are normal. Pupils are equal, round, and reactive to light. No scleral icterus.  Neck: Normal range of motion. Neck supple. No tracheal deviation present.  Cardiovascular: Normal rate, regular rhythm and intact distal pulses.   Pulmonary/Chest: Effort normal and breath sounds normal. No stridor. No respiratory distress. Chest wall is not dull to percussion. She exhibits no mass and no tenderness. Right breast exhibits no inverted nipple, no mass, no nipple discharge, no skin change and no tenderness. Left breast exhibits no inverted nipple, no mass, no nipple discharge, no skin change and no tenderness. Breasts are symmetrical.    Abdominal: Soft. She exhibits no distension and no mass. There is no tenderness. Hernia confirmed negative in the right inguinal area and confirmed negative in the left inguinal area.    Genitourinary: No vaginal discharge found.  Musculoskeletal: Normal range of motion. She exhibits no tenderness.       Right elbow: She exhibits normal range of motion.       Left elbow: She exhibits normal range of motion.       Right wrist: She exhibits normal range of motion.       Left wrist: She exhibits normal range of motion.       Right hand: Normal strength noted.       Left hand: Normal strength noted.  Lymphadenopathy:       Head (right side): No submental, no submandibular, no preauricular, no posterior auricular and no occipital adenopathy present.       Head (left side): No submental, no preauricular, no posterior auricular and no occipital adenopathy present.    She has no cervical adenopathy.    She has axillary adenopathy.       Right axillary: Pectoral adenopathy present.       Left axillary: No pectoral and no  lateral adenopathy present.      Right: No inguinal and no supraclavicular adenopathy present.       Left: No inguinal and no supraclavicular adenopathy present.  Neurological: She is alert and oriented to person, place, and time. No cranial nerve deficit. She exhibits normal muscle tone. Coordination normal.  Skin: Skin is warm and dry. No rash noted. She is not diaphoretic. No erythema.  Psychiatric: She has a normal mood and affect. Her behavior is normal. Judgment and thought content normal.       Assessment:     Right axillary tenderness and swelling most consistent with inflamed lymph nodes vs. Cellulitis in pregnant woman.  History of similar presentation 3 years ago resolved w antibiotics.     Plan:     This is not seen consistent with a infected sebaceous cyst or hydradenitis.  There is no tumor mass.  No evidence of lymphadenopathy elsewhere.  Most likely some mild  cellulitis.  There is no evidence of breast cancer or mastitis.  I would keep it simple & do a course of antibiotics.  Cephalexin for 7 days.  Refill if needed.  Do warm compresses and Tylenol for pain control.  This gradually should resolve.  Discussed my partner, Dr. Lucia Gaskins.   This needs to be followed.  See again in clinic in 4 weeks. If things worsen or intensify, consider more aggressive evaluation with ultrasound.  Many need I&D or IV antibiotics.

## 2014-03-15 NOTE — Patient Instructions (Addendum)
Cellulitis Cellulitis is an infection of the skin and the tissue beneath it. The infected area is usually red and tender. Cellulitis occurs most often in the arms and lower legs.  CAUSES  Cellulitis is caused by bacteria that enter the skin through cracks or cuts in the skin. The most common types of bacteria that cause cellulitis are Staphylococcus and Streptococcus. SYMPTOMS   Redness and warmth.  Swelling.  Tenderness or pain.  Fever. DIAGNOSIS  Your caregiver can usually determine what is wrong based on a physical exam. Blood tests may also be done. TREATMENT  Treatment usually involves taking an antibiotic medicine. HOME CARE INSTRUCTIONS   Take your antibiotics as directed. Finish them even if you start to feel better.  Keep the infected arm or leg elevated to reduce swelling.  Apply a warm cloth to the affected area up to 4 times per day to relieve pain.  Only take over-the-counter or prescription medicines for pain, discomfort, or fever as directed by your caregiver.  Keep all follow-up appointments as directed by your caregiver. SEEK MEDICAL CARE IF:   You notice red streaks coming from the infected area.  Your red area gets larger or turns dark in color.  Your bone or joint underneath the infected area becomes painful after the skin has healed.  Your infection returns in the same area or another area.  You notice a swollen bump in the infected area.  You develop new symptoms. SEEK IMMEDIATE MEDICAL CARE IF:   You have a fever.  You feel very sleepy.  You develop vomiting or diarrhea.  You have a general ill feeling (malaise) with muscle aches and pains. MAKE SURE YOU:   Understand these instructions.  Will watch your condition.  Will get help right away if you are not doing well or get worse. Document Released: 06/18/2005 Document Revised: 03/09/2012 Document Reviewed: 11/24/2011 [employer] Patient Information 2015 [employer]. This information is  not intended to replace advice given to you by your health care provider. Make sure you discuss any questions you have with your health care provider.  

## 2014-03-19 ENCOUNTER — Encounter (HOSPITAL_COMMUNITY): Payer: Self-pay | Admitting: *Deleted

## 2014-03-19 ENCOUNTER — Inpatient Hospital Stay (HOSPITAL_COMMUNITY)
Admission: AD | Admit: 2014-03-19 | Discharge: 2014-03-19 | Disposition: A | Discharge status: Home / Self Care | Payer: Medicaid Other | Source: Ambulatory Visit | Attending: Obstetrics and Gynecology | Admitting: Obstetrics and Gynecology

## 2014-03-19 ENCOUNTER — Other Ambulatory Visit: Payer: Self-pay | Admitting: Obstetrics and Gynecology

## 2014-03-19 DIAGNOSIS — O4700 False labor before 37 completed weeks of gestation, unspecified trimester: Secondary | ICD-10-CM | POA: Insufficient documentation

## 2014-03-19 DIAGNOSIS — O99891 Other specified diseases and conditions complicating pregnancy: Secondary | ICD-10-CM | POA: Insufficient documentation

## 2014-03-19 DIAGNOSIS — O9989 Other specified diseases and conditions complicating pregnancy, childbirth and the puerperium: Secondary | ICD-10-CM

## 2014-03-19 DIAGNOSIS — IMO0002 Reserved for concepts with insufficient information to code with codable children: Secondary | ICD-10-CM | POA: Insufficient documentation

## 2014-03-19 DIAGNOSIS — R109 Unspecified abdominal pain: Secondary | ICD-10-CM | POA: Insufficient documentation

## 2014-03-19 LAB — URINALYSIS, ROUTINE W REFLEX MICROSCOPIC
BILIRUBIN URINE: NEGATIVE
Glucose, UA: NEGATIVE mg/dL
Ketones, ur: NEGATIVE mg/dL
Nitrite: NEGATIVE
Protein, ur: NEGATIVE mg/dL
Specific Gravity, Urine: 1.01 (ref 1.005–1.030)
Urobilinogen, UA: 0.2 mg/dL (ref 0.0–1.0)
pH: 7 (ref 5.0–8.0)

## 2014-03-19 LAB — WET PREP, GENITAL
Clue Cells Wet Prep HPF POC: NONE SEEN
Trich, Wet Prep: NONE SEEN
YEAST WET PREP: NONE SEEN

## 2014-03-19 LAB — FETAL FIBRONECTIN: Fetal Fibronectin: NEGATIVE

## 2014-03-19 LAB — URINE MICROSCOPIC-ADD ON: RBC / HPF: NONE SEEN RBC/hpf (ref <3)

## 2014-03-19 MED ORDER — NIFEDIPINE 10 MG PO CAPS
10.0000 mg | ORAL_CAPSULE | ORAL | Status: DC | PRN
  Administered 2014-03-19 (×2): 10 mg via ORAL
  Filled 2014-03-19 (×2): qty 1

## 2014-03-19 NOTE — Discharge Instructions (Signed)

## 2014-03-19 NOTE — MAU Provider Note (Signed)
History   18 yo G1P0 at 29 weeks presented after calling with back pain and lower abdominal cramping since last night.  Denies leaking or bleeding, reports urinary pressure, +FM, no recent IC.  Patient Active Problem List   Diagnosis Date Noted  . Cellulitis of axilla, right 03/15/2014  . Pregnant and not yet delivered in second trimester - [facility] 06/04/2014 03/15/2014  Seen at [facility] last week for swelling in right axilla--dx with cellulitis, Rx'd with Keflex.  To f/u if no improvement.  Patient reports area feels better, slightly smaller in volume.  Chief Complaint  Patient presents with  . Abdominal Cramping  . Back Pain   HPI:  See above  OB History   Grav Para Term Preterm Abortions TAB SAB Ect Mult Living   1         0      Past Medical History  Diagnosis Date  . UTI (lower urinary tract infection)   . Anxiety     never treated    Past Surgical History  Procedure Laterality Date  . No past surgeries      Family History  Problem Relation Age of Onset  . Cancer Father     melanoma    History  Substance Use Topics  . Smoking status: Never Smoker   . Smokeless tobacco: Never Used  . Alcohol Use: No    Allergies: No Known Allergies  Prescriptions prior to admission  Medication Sig Dispense Refill  . cephALEXin (KEFLEX) 500 MG capsule Take 1 capsule (500 mg total) by mouth 3 (three) times daily.  21 capsule  1  . Prenatal Vit-Fe Fumarate-FA (PRENATAL MULTIVITAMIN) TABS tablet Take 1 tablet by mouth daily at 12 noon.        ROS:  Low back pain, lower abdominal cramping Physical Exam   Blood pressure 115/70, pulse 84, temperature 98.2 F (36.8 C), resp. rate 16, last menstrual period 08/28/2013.  Physical Exam In NAD Chest clear Heart RRR without murmur Abd gravid, NT No CVAT Pelvic--cervix closed, long, firm, vtx, ballotable, -2. Ext WNL Right axillary swelling approx 4 cm, soft, no erythema noted, NT.  FHR Category 1 UCs mild irritability,  scattered quick/mild UCs  ED Course  Assessment: IUP at 29 weeks Uterine irritability/activity Cellulitis in right axilla--improving, on Keflex.  Plan: FFN, wet prep, GBS, GC/chlamydia UA Observe uterine activity at present Push po fluids.   Donnel Saxon CNM, MSN 03/19/2014 12:25 PM  Addendum: Received 2 doses Procardia for irregular, mild contractions, with benefit.  Results for orders placed during the hospital encounter of 03/19/14 (from the past 24 hour(s))  URINALYSIS, ROUTINE W REFLEX MICROSCOPIC     Status: Abnormal   Collection Time    03/19/14 11:50 AM      Result Value Ref Range   Color, Urine YELLOW  YELLOW   APPearance CLEAR  CLEAR   Specific Gravity, Urine 1.010  1.005 - 1.030   pH 7.0  5.0 - 8.0   Glucose, UA NEGATIVE  NEGATIVE mg/dL   Hgb urine dipstick TRACE (*) NEGATIVE   Bilirubin Urine NEGATIVE  NEGATIVE   Ketones, ur NEGATIVE  NEGATIVE mg/dL   Protein, ur NEGATIVE  NEGATIVE mg/dL   Urobilinogen, UA 0.2  0.0 - 1.0 mg/dL   Nitrite NEGATIVE  NEGATIVE   Leukocytes, UA SMALL (*) NEGATIVE  URINE MICROSCOPIC-ADD ON     Status: Abnormal   Collection Time    03/19/14 11:50 AM      Result Value  Ref Range   Squamous Epithelial / LPF MANY (*) RARE   WBC, UA 7-10  <3 WBC/hpf   RBC / HPF    <3 RBC/hpf   Value: NO FORMED ELEMENTS SEEN ON URINE MICROSCOPIC EXAMINATION   Bacteria, UA RARE  RARE  FETAL FIBRONECTIN     Status: None   Collection Time    03/19/14 12:20 PM      Result Value Ref Range   Fetal Fibronectin NEGATIVE  NEGATIVE  WET PREP, GENITAL     Status: Abnormal   Collection Time    03/19/14 12:20 PM      Result Value Ref Range   Yeast Wet Prep HPF POC NONE SEEN  NONE SEEN   Trich, Wet Prep NONE SEEN  NONE SEEN   Clue Cells Wet Prep HPF POC NONE SEEN  NONE SEEN   WBC, Wet Prep HPF POC MODERATE (*) NONE SEEN   FHR Category 1 VSS  D/C'd home with PTL precautions. Notes for work and school--will be OOW this week until re-evaluation at [place]  at [employer]. To call with any increase in any sx or any questions.  Donnel Saxon, CNM 03/19/14 1530

## 2014-03-19 NOTE — MAU Note (Signed)
Pt presents to MAU with complaints of abdominal cramping, back pain and vaginal pressure since last night. Denies any vaginal bleeding or LOF

## 2014-03-20 LAB — GC/CHLAMYDIA PROBE AMP
CT Probe RNA: NEGATIVE
GC Probe RNA: NEGATIVE

## 2014-03-21 ENCOUNTER — Encounter (HOSPITAL_COMMUNITY): Payer: Self-pay | Admitting: *Deleted

## 2014-03-21 ENCOUNTER — Inpatient Hospital Stay (HOSPITAL_COMMUNITY)
Admission: AD | Admit: 2014-03-21 | Discharge: 2014-03-21 | Disposition: A | Discharge status: Home / Self Care | Payer: Medicaid Other | Source: Ambulatory Visit | Attending: Obstetrics and Gynecology | Admitting: Obstetrics and Gynecology

## 2014-03-21 DIAGNOSIS — O4700 False labor before 37 completed weeks of gestation, unspecified trimester: Secondary | ICD-10-CM | POA: Insufficient documentation

## 2014-03-21 DIAGNOSIS — O4703 False labor before 37 completed weeks of gestation, third trimester: Secondary | ICD-10-CM

## 2014-03-21 LAB — CULTURE, OB URINE
Colony Count: NO GROWTH
Culture: NO GROWTH

## 2014-03-21 MED ORDER — NIFEDIPINE 10 MG PO CAPS: 10.0000 mg | ORAL_CAPSULE | ORAL | Status: DC | PRN

## 2014-03-21 MED ORDER — NIFEDIPINE 10 MG PO CAPS
10.0000 mg | ORAL_CAPSULE | Freq: Once | ORAL | Status: AC
  Administered 2014-03-21: 10 mg via ORAL
  Filled 2014-03-21: qty 1

## 2014-03-21 NOTE — MAU Provider Note (Signed)
History  18 yo G1P0 @ [redacted]w[redacted]d presented unannounced w/ c/o contractions. Was seen in MAU on 6/28 and had neg PTL w/u. She received a couple of doses of Procardia and d/c'd home. She called the office yesterday stating she was contracting more despite po fluids and rest. She was advised to come in, but did not as her ctxs stopped. States through the night, contractions have picked back up. Denies LOF, VB. Reports +FM.   Patient Active Problem List   Diagnosis Date Noted  . Preterm uterine contractions in third trimester, antepartum 03/21/2014  . Cellulitis of axilla, right 03/15/2014  . Pregnant and not yet delivered in second trimester - [facility] 06/04/2014 03/15/2014    Chief Complaint  Patient presents with  . Contractions   HPI See above OB History   Grav Para Term Preterm Abortions TAB SAB Ect Mult Living   1         0      Past Medical History  Diagnosis Date  . UTI (lower urinary tract infection)   . Anxiety     never treated    Past Surgical History  Procedure Laterality Date  . No past surgeries      Family History  Problem Relation Age of Onset  . Cancer Father     melanoma    History  Substance Use Topics  . Smoking status: Never Smoker   . Smokeless tobacco: Never Used  . Alcohol Use: No    Allergies: No Known Allergies  No prescriptions prior to admission    ROS Contractions +FM    I Physical Exam Gen: NAD Lungs: CTAB CV: RRR Abd-soft, NTND, mild tightening noted Pelvic: closed/thick Mild ctxs per toco; uterine irritability Ext WNL Blood pressure 103/68, pulse 75, temperature 98.2 F (36.8 C), temperature source Oral, resp. rate 18, weight 150 lb (68.04 kg), last menstrual period 08/28/2013.  ED Course  Assessment: PTCs w/o cervical change Reasurring FHRT for this gestational age; ctxs subsided while in MAU with rest and po fluids  Plan: Procardia prn contractions Keep next scheduled appt [facility] per protocol Strict PTL  precautions Continue previous instructions   Farrel Gordon CNM, MS 03/21/2014 10:37 AM

## 2014-03-21 NOTE — Discharge Instructions (Signed)
Fetal Movement Counts Patient Name: __________________________________________________ Patient Due Date: ____________________ Performing a fetal movement count is highly recommended in high-risk pregnancies, but it is good for every pregnant woman to do. Your caregiver may ask you to start counting fetal movements at 28 weeks of the pregnancy. Fetal movements often increase:  After eating a full meal.  After physical activity.  After eating or drinking something sweet or cold.  At rest. Pay attention to when you feel the baby is most active. This will help you notice a pattern of your baby's sleep and wake cycles and what factors contribute to an increase in fetal movement. It is important to perform a fetal movement count at the same time each day when your baby is normally most active.  HOW TO COUNT FETAL MOVEMENTS 1. Find a quiet and comfortable area to sit or lie down on your left side. Lying on your left side provides the best blood and oxygen circulation to your baby. 2. Write down the day and time on a sheet of paper or in a journal. 3. Start counting kicks, flutters, swishes, rolls, or jabs in a 2 hour period. You should feel at least 10 movements within 2 hours. 4. If you do not feel 10 movements in 2 hours, wait 2-3 hours and count again. Look for a change in the pattern or not enough counts in 2 hours. SEEK MEDICAL CARE IF:  You feel less than 10 counts in 2 hours, tried twice.  There is no movement in over an hour.  The pattern is changing or taking longer each day to reach 10 counts in 2 hours.  You feel the baby is not moving as he or she usually does. Date: ____________ Movements: ____________ Start time: ____________ Elizebeth Koller time: ____________  Date: ____________ Movements: ____________ Start time: ____________ Elizebeth Koller time: ____________ Date: ____________ Movements: ____________ Start time: ____________ Elizebeth Koller time: ____________ Date: ____________ Movements: ____________  Start time: ____________ Elizebeth Koller time: ____________ Date: ____________ Movements: ____________ Start time: ____________ Elizebeth Koller time: ____________ Date: ____________ Movements: ____________ Start time: ____________ Elizebeth Koller time: ____________ Date: ____________ Movements: ____________ Start time: ____________ Elizebeth Koller time: ____________ Date: ____________ Movements: ____________ Start time: ____________ Elizebeth Koller time: ____________  Date: ____________ Movements: ____________ Start time: ____________ Elizebeth Koller time: ____________ Date: ____________ Movements: ____________ Start time: ____________ Elizebeth Koller time: ____________ Date: ____________ Movements: ____________ Start time: ____________ Elizebeth Koller time: ____________ Date: ____________ Movements: ____________ Start time: ____________ Elizebeth Koller time: ____________ Date: ____________ Movements: ____________ Start time: ____________ Elizebeth Koller time: ____________ Date: ____________ Movements: ____________ Start time: ____________ Elizebeth Koller time: ____________ Date: ____________ Movements: ____________ Start time: ____________ Elizebeth Koller time: ____________  Date: ____________ Movements: ____________ Start time: ____________ Elizebeth Koller time: ____________ Date: ____________ Movements: ____________ Start time: ____________ Elizebeth Koller time: ____________ Date: ____________ Movements: ____________ Start time: ____________ Elizebeth Koller time: ____________ Date: ____________ Movements: ____________ Start time: ____________ Elizebeth Koller time: ____________ Date: ____________ Movements: ____________ Start time: ____________ Elizebeth Koller time: ____________ Date: ____________ Movements: ____________ Start time: ____________ Elizebeth Koller time: ____________ Date: ____________ Movements: ____________ Start time: ____________ Elizebeth Koller time: ____________  Date: ____________ Movements: ____________ Start time: ____________ Elizebeth Koller time: ____________ Date: ____________ Movements: ____________ Start time: ____________ Elizebeth Koller time:  ____________ Date: ____________ Movements: ____________ Start time: ____________ Elizebeth Koller time: ____________ Date: ____________ Movements: ____________ Start time: ____________ Elizebeth Koller time: ____________ Date: ____________ Movements: ____________ Start time: ____________ Elizebeth Koller time: ____________ Date: ____________ Movements: ____________ Start time: ____________ Elizebeth Koller time: ____________ Date: ____________ Movements: ____________ Start time: ____________ Elizebeth Koller time: ____________  Date: ____________ Movements: ____________ Start time: ____________ Elizebeth Koller time:  ____________ Date: ____________ Movements: ____________ Start time: ____________ Elizebeth Koller time: ____________ Date: ____________ Movements: ____________ Start time: ____________ Elizebeth Koller time: ____________ Date: ____________ Movements: ____________ Start time: ____________ Elizebeth Koller time: ____________ Date: ____________ Movements: ____________ Start time: ____________ Elizebeth Koller time: ____________ Date: ____________ Movements: ____________ Start time: ____________ Elizebeth Koller time: ____________ Date: ____________ Movements: ____________ Start time: ____________ Elizebeth Koller time: ____________  Date: ____________ Movements: ____________ Start time: ____________ Elizebeth Koller time: ____________ Date: ____________ Movements: ____________ Start time: ____________ Elizebeth Koller time: ____________ Date: ____________ Movements: ____________ Start time: ____________ Elizebeth Koller time: ____________ Date: ____________ Movements: ____________ Start time: ____________ Elizebeth Koller time: ____________ Date: ____________ Movements: ____________ Start time: ____________ Elizebeth Koller time: ____________ Date: ____________ Movements: ____________ Start time: ____________ Elizebeth Koller time: ____________ Date: ____________ Movements: ____________ Start time: ____________ Elizebeth Koller time: ____________  Date: ____________ Movements: ____________ Start time: ____________ Elizebeth Koller time: ____________ Date: ____________ Movements:  ____________ Start time: ____________ Elizebeth Koller time: ____________ Date: ____________ Movements: ____________ Start time: ____________ Elizebeth Koller time: ____________ Date: ____________ Movements: ____________ Start time: ____________ Elizebeth Koller time: ____________ Date: ____________ Movements: ____________ Start time: ____________ Elizebeth Koller time: ____________ Date: ____________ Movements: ____________ Start time: ____________ Elizebeth Koller time: ____________ Date: ____________ Movements: ____________ Start time: ____________ Elizebeth Koller time: ____________  Date: ____________ Movements: ____________ Start time: ____________ Elizebeth Koller time: ____________ Date: ____________ Movements: ____________ Start time: ____________ Elizebeth Koller time: ____________ Date: ____________ Movements: ____________ Start time: ____________ Elizebeth Koller time: ____________ Date: ____________ Movements: ____________ Start time: ____________ Elizebeth Koller time: ____________ Date: ____________ Movements: ____________ Start time: ____________ Elizebeth Koller time: ____________ Date: ____________ Movements: ____________ Start time: ____________ Elizebeth Koller time: ____________ Document Released: 10/08/2006 Document Revised: 08/25/2012 Document Reviewed: 07/05/2012 [employer] Patient Information 2015 [employer]. This information is not intended to replace advice given to you by your health care provider. Make sure you discuss any questions you have with your health care provider. Braxton Hicks Contractions Contractions of the uterus can occur throughout pregnancy. Contractions are not always a sign that you are in labor.  WHAT ARE BRAXTON HICKS CONTRACTIONS?  Contractions that occur before labor are called Braxton Hicks contractions, or false labor. Toward the end of pregnancy (32-34 weeks), these contractions can develop more often and may become more forceful. This is not true labor because these contractions do not result in opening (dilatation) and thinning of the cervix. They are  sometimes difficult to tell apart from true labor because these contractions can be forceful and people have different pain tolerances. You should not feel embarrassed if you go to the hospital with false labor. Sometimes, the only way to tell if you are in true labor is for your health care provider to look for changes in the cervix. If there are no prenatal problems or other health problems associated with the pregnancy, it is completely safe to be sent home with false labor and await the onset of true labor. HOW CAN YOU TELL THE DIFFERENCE BETWEEN TRUE AND FALSE LABOR? False Labor  The contractions of false labor are usually shorter and not as hard as those of true labor.   The contractions are usually irregular.   The contractions are often felt in the front of the lower abdomen and in the groin.   The contractions may go away when you walk around or change positions while lying down.   The contractions get weaker and are shorter lasting as time goes on.   The contractions do not usually become progressively stronger, regular, and closer together as with true labor.  True Labor  Contractions in true labor last 30-70 seconds, become very  regular, usually become more intense, and increase in frequency.   The contractions do not go away with walking.   The discomfort is usually felt in the top of the uterus and spreads to the lower abdomen and low back.   True labor can be determined by your health care provider with an exam. This will show that the cervix is dilating and getting thinner.  WHAT TO REMEMBER  Keep up with your usual exercises and follow other instructions given by your health care provider.   Take medicines as directed by your health care provider.   Keep your regular prenatal appointments.   Eat and drink lightly if you think you are going into labor.   If Braxton Hicks contractions are making you uncomfortable:   Change your position from lying  down or resting to walking, or from walking to resting.   Sit and rest in a tub of warm water.   Drink 2-3 glasses of water. Dehydration may cause these contractions.   Do slow and deep breathing several times an hour.  WHEN SHOULD I SEEK IMMEDIATE MEDICAL CARE? Seek immediate medical care if:  Your contractions become stronger, more regular, and closer together.   You have fluid leaking or gushing from your vagina.   You have a fever.   You pass blood-tinged mucus.   You have vaginal bleeding.   You have continuous abdominal pain.   You have low back pain that you never had before.   You feel your baby's head pushing down and causing pelvic pressure.   Your baby is not moving as much as it used to.  Document Released: 09/08/2005 Document Revised: 09/13/2013 Document Reviewed: 06/20/2013 [facility] Patient Information 2015 [place], [place]. This information is not intended to replace advice given to you by your health care provider. Make sure you discuss any questions you have with your health care provider.

## 2014-03-21 NOTE — MAU Note (Signed)
States she called the office yesterday when she was contracting and was advised to come in, but she waited and contractions stopped. States contractions have been off and on throughout the night.

## 2014-03-21 NOTE — MAU Note (Signed)
Contractions every 57min.  Yesterday at one time were every 104minutes and then they stopped.  Was seen on Sunday here, cervix was closed.  Has had a neg fFN.   Denies bleeding or leaking

## 2014-03-22 LAB — CULTURE, BETA STREP (GROUP B ONLY)

## 2014-04-10 ENCOUNTER — Inpatient Hospital Stay (HOSPITAL_COMMUNITY)
Admission: AD | Admit: 2014-04-10 | Discharge: 2014-04-10 | Disposition: A | Discharge status: Home / Self Care | Payer: Medicaid Other | Source: Ambulatory Visit | Attending: Obstetrics and Gynecology | Admitting: Obstetrics and Gynecology

## 2014-04-10 ENCOUNTER — Encounter (HOSPITAL_COMMUNITY): Payer: Self-pay | Admitting: *Deleted

## 2014-04-10 ENCOUNTER — Inpatient Hospital Stay (HOSPITAL_COMMUNITY): Payer: Medicaid Other

## 2014-04-10 DIAGNOSIS — O368190 Decreased fetal movements, unspecified trimester, not applicable or unspecified: Secondary | ICD-10-CM | POA: Insufficient documentation

## 2014-04-10 DIAGNOSIS — O4700 False labor before 37 completed weeks of gestation, unspecified trimester: Secondary | ICD-10-CM | POA: Insufficient documentation

## 2014-04-10 DIAGNOSIS — O368130 Decreased fetal movements, third trimester, not applicable or unspecified: Secondary | ICD-10-CM | POA: Insufficient documentation

## 2014-04-10 NOTE — MAU Provider Note (Signed)
History   18 yo G1P0 @ 32.1 wks presents to MAU from office w/ c/o decreased FM x 3-4 days, "steady declining." Had office appt today, NST non-reactive per Dr. Alesia Richards. Here for BPP. Denies VB, LOF. +ctxs.  Patient Active Problem List   Diagnosis Date Noted  . Decreased fetal movement during pregnancy in third trimester, antepartum 04/10/2014  . Preterm uterine contractions in third trimester, antepartum 03/21/2014  . Cellulitis of axilla, right 03/15/2014  . Pregnant and not yet delivered in second trimester - [facility] 06/04/2014 03/15/2014    Chief Complaint  Patient presents with  . non-reactive tracing- sent from office    . Decreased Fetal Movement   HPI See above OB History   Grav Para Term Preterm Abortions TAB SAB Ect Mult Living   1         0      Past Medical History  Diagnosis Date  . UTI (lower urinary tract infection)   . Anxiety     never treated  . Medical history non-contributory     Past Surgical History  Procedure Laterality Date  . No past surgeries      Family History  Problem Relation Age of Onset  . Cancer Father     melanoma    History  Substance Use Topics  . Smoking status: Never Smoker   . Smokeless tobacco: Never Used  . Alcohol Use: No    Allergies: No Known Allergies  Prescriptions prior to admission  Medication Sig Dispense Refill  . NIFEdipine (PROCARDIA) 10 MG capsule Take 1 capsule (10 mg total) by mouth every 4 (four) hours as needed.  12 capsule  0  . Prenatal Vit-Fe Fumarate-FA (PRENATAL MULTIVITAMIN) TABS tablet Take 1 tablet by mouth daily at 12 noon.        ROS Decreased FM Ctxs  Physical Exam   Blood pressure 116/68, pulse 91, temperature 98.4 F (36.9 C), temperature source Oral, resp. rate 18, last menstrual period 08/28/2013.   Physical Exam Gen: NAD Lungs: CTAB CV: RRR Abd: gravid, soft, NTND Pelvic: deferred FHR: Reassuring for this gestational age Toco: None palpated, some irritability noted  ED Course   Assessment: IUP at 32.1 wks Reassuring FHRT for this gestational age Uterine irritability/ctxs BPP: Normal amniotic fluid volume; largest pocket 6.3 cm; 8/8   Plan: Here for BPP only. Results shared w/ pt. Explained anterior placenta as reason she may not perceive fetal movements, but assured her that baby is active and BPP is WNL.  Strict PTL precautions. Encouraged Procardia use. Pelvic rest/hydration. Keep next scheduled appt on 04/24/14.   Farrel Gordon CNM, MS 04/10/14 @ 3:00 PM

## 2014-04-10 NOTE — MAU Note (Signed)
Pt. Sent from office for a non-reactive NST. Pt. Says she was seen 3 weeks ago for preterm labor and was put on Procardia. Pt. Has not taken procardia in the last 2 days as it gave her a yeast infection.

## 2014-04-10 NOTE — MAU Note (Signed)
Sent from office for BPP, CNM here and placed orders

## 2014-04-10 NOTE — Discharge Instructions (Signed)
Fetal Movement Counts Patient Name: __________________________________________________ Patient Due Date: ____________________ Performing a fetal movement count is highly recommended in high-risk pregnancies, but it is good for every pregnant woman to do. Your caregiver may ask you to start counting fetal movements at 28 weeks of the pregnancy. Fetal movements often increase:  After eating a full meal.  After physical activity.  After eating or drinking something sweet or cold.  At rest. Pay attention to when you feel the baby is most active. This will help you notice a pattern of your baby's sleep and wake cycles and what factors contribute to an increase in fetal movement. It is important to perform a fetal movement count at the same time each day when your baby is normally most active.  HOW TO COUNT FETAL MOVEMENTS 1. Find a quiet and comfortable area to sit or lie down on your left side. Lying on your left side provides the best blood and oxygen circulation to your baby. 2. Write down the day and time on a sheet of paper or in a journal. 3. Start counting kicks, flutters, swishes, rolls, or jabs in a 2 hour period. You should feel at least 10 movements within 2 hours. 4. If you do not feel 10 movements in 2 hours, wait 2-3 hours and count again. Look for a change in the pattern or not enough counts in 2 hours. SEEK MEDICAL CARE IF:  You feel less than 10 counts in 2 hours, tried twice.  There is no movement in over an hour.  The pattern is changing or taking longer each day to reach 10 counts in 2 hours.  You feel the baby is not moving as he or she usually does. Date: ____________ Movements: ____________ Start time: ____________ Elizebeth Koller time: ____________  Date: ____________ Movements: ____________ Start time: ____________ Elizebeth Koller time: ____________ Date: ____________ Movements: ____________ Start time: ____________ Elizebeth Koller time: ____________ Date: ____________ Movements: ____________  Start time: ____________ Elizebeth Koller time: ____________ Date: ____________ Movements: ____________ Start time: ____________ Elizebeth Koller time: ____________ Date: ____________ Movements: ____________ Start time: ____________ Elizebeth Koller time: ____________ Date: ____________ Movements: ____________ Start time: ____________ Elizebeth Koller time: ____________ Date: ____________ Movements: ____________ Start time: ____________ Elizebeth Koller time: ____________  Date: ____________ Movements: ____________ Start time: ____________ Elizebeth Koller time: ____________ Date: ____________ Movements: ____________ Start time: ____________ Elizebeth Koller time: ____________ Date: ____________ Movements: ____________ Start time: ____________ Elizebeth Koller time: ____________ Date: ____________ Movements: ____________ Start time: ____________ Elizebeth Koller time: ____________ Date: ____________ Movements: ____________ Start time: ____________ Elizebeth Koller time: ____________ Date: ____________ Movements: ____________ Start time: ____________ Elizebeth Koller time: ____________ Date: ____________ Movements: ____________ Start time: ____________ Elizebeth Koller time: ____________  Date: ____________ Movements: ____________ Start time: ____________ Elizebeth Koller time: ____________ Date: ____________ Movements: ____________ Start time: ____________ Elizebeth Koller time: ____________ Date: ____________ Movements: ____________ Start time: ____________ Elizebeth Koller time: ____________ Date: ____________ Movements: ____________ Start time: ____________ Elizebeth Koller time: ____________ Date: ____________ Movements: ____________ Start time: ____________ Elizebeth Koller time: ____________ Date: ____________ Movements: ____________ Start time: ____________ Elizebeth Koller time: ____________ Date: ____________ Movements: ____________ Start time: ____________ Elizebeth Koller time: ____________  Date: ____________ Movements: ____________ Start time: ____________ Elizebeth Koller time: ____________ Date: ____________ Movements: ____________ Start time: ____________ Elizebeth Koller time:  ____________ Date: ____________ Movements: ____________ Start time: ____________ Elizebeth Koller time: ____________ Date: ____________ Movements: ____________ Start time: ____________ Elizebeth Koller time: ____________ Date: ____________ Movements: ____________ Start time: ____________ Elizebeth Koller time: ____________ Date: ____________ Movements: ____________ Start time: ____________ Elizebeth Koller time: ____________ Date: ____________ Movements: ____________ Start time: ____________ Elizebeth Koller time: ____________  Date: ____________ Movements: ____________ Start time: ____________ Elizebeth Koller time:  ____________ Date: ____________ Movements: ____________ Start time: ____________ Elizebeth Koller time: ____________ Date: ____________ Movements: ____________ Start time: ____________ Elizebeth Koller time: ____________ Date: ____________ Movements: ____________ Start time: ____________ Elizebeth Koller time: ____________ Date: ____________ Movements: ____________ Start time: ____________ Elizebeth Koller time: ____________ Date: ____________ Movements: ____________ Start time: ____________ Elizebeth Koller time: ____________ Date: ____________ Movements: ____________ Start time: ____________ Elizebeth Koller time: ____________  Date: ____________ Movements: ____________ Start time: ____________ Elizebeth Koller time: ____________ Date: ____________ Movements: ____________ Start time: ____________ Elizebeth Koller time: ____________ Date: ____________ Movements: ____________ Start time: ____________ Elizebeth Koller time: ____________ Date: ____________ Movements: ____________ Start time: ____________ Elizebeth Koller time: ____________ Date: ____________ Movements: ____________ Start time: ____________ Elizebeth Koller time: ____________ Date: ____________ Movements: ____________ Start time: ____________ Elizebeth Koller time: ____________ Date: ____________ Movements: ____________ Start time: ____________ Elizebeth Koller time: ____________  Date: ____________ Movements: ____________ Start time: ____________ Elizebeth Koller time: ____________ Date: ____________ Movements:  ____________ Start time: ____________ Elizebeth Koller time: ____________ Date: ____________ Movements: ____________ Start time: ____________ Elizebeth Koller time: ____________ Date: ____________ Movements: ____________ Start time: ____________ Elizebeth Koller time: ____________ Date: ____________ Movements: ____________ Start time: ____________ Elizebeth Koller time: ____________ Date: ____________ Movements: ____________ Start time: ____________ Elizebeth Koller time: ____________ Date: ____________ Movements: ____________ Start time: ____________ Elizebeth Koller time: ____________  Date: ____________ Movements: ____________ Start time: ____________ Elizebeth Koller time: ____________ Date: ____________ Movements: ____________ Start time: ____________ Elizebeth Koller time: ____________ Date: ____________ Movements: ____________ Start time: ____________ Elizebeth Koller time: ____________ Date: ____________ Movements: ____________ Start time: ____________ Elizebeth Koller time: ____________ Date: ____________ Movements: ____________ Start time: ____________ Elizebeth Koller time: ____________ Date: ____________ Movements: ____________ Start time: ____________ Elizebeth Koller time: ____________ Document Released: 10/08/2006 Document Revised: 08/25/2012 Document Reviewed: 07/05/2012 Exit[employer] Patient Information 2015 [employer]. This information is not intended to replace advice given to you by your health care provider. Make sure you discuss any questions you have with your health care provider.

## 2014-04-17 ENCOUNTER — Inpatient Hospital Stay (HOSPITAL_COMMUNITY)
Admission: AD | Admit: 2014-04-17 | Discharge: 2014-04-17 | Disposition: A | Discharge status: Home / Self Care | Payer: Medicaid Other | Source: Ambulatory Visit | Attending: Obstetrics and Gynecology | Admitting: Obstetrics and Gynecology

## 2014-04-17 ENCOUNTER — Encounter (INDEPENDENT_AMBULATORY_CARE_PROVIDER_SITE_OTHER): Payer: Medicaid Other | Admitting: Surgery

## 2014-04-17 ENCOUNTER — Inpatient Hospital Stay (HOSPITAL_COMMUNITY): Payer: Medicaid Other

## 2014-04-17 DIAGNOSIS — B3731 Acute candidiasis of vulva and vagina: Secondary | ICD-10-CM | POA: Insufficient documentation

## 2014-04-17 DIAGNOSIS — O368190 Decreased fetal movements, unspecified trimester, not applicable or unspecified: Secondary | ICD-10-CM | POA: Diagnosis present

## 2014-04-17 DIAGNOSIS — B373 Candidiasis of vulva and vagina: Secondary | ICD-10-CM | POA: Insufficient documentation

## 2014-04-17 DIAGNOSIS — O2390 Unspecified genitourinary tract infection in pregnancy, unspecified trimester: Secondary | ICD-10-CM | POA: Diagnosis not present

## 2014-04-17 NOTE — MAU Note (Signed)
Patient discharged home by Irena Reichmann CNM

## 2014-04-17 NOTE — MAU Note (Signed)
Prince Solian CNM in triage to discuss BPP results with patient.

## 2014-04-17 NOTE — MAU Note (Signed)
Sent from office, non-reactive tracing in office. Sent for BPP

## 2014-04-17 NOTE — MAU Provider Note (Signed)
  History  Received report and assumed care of this 18 yo G1P0 @ 33.1 wks who presented to MAU from office for BPP. States was seen in office today for ongoing vaginal itching and decreased FM. States decreased FM began when she started the Procardia and is no longer taking the medication. She is unsure of the number of fetal kicks since arriving to MAU.  Denies VB, ctxs or LOF.   Patient Active Problem List   Diagnosis Date Noted  . Yeast vaginitis 04/17/2014  . Decreased fetal movement during pregnancy in third trimester, antepartum 04/10/2014  . Preterm uterine contractions in third trimester, antepartum 03/21/2014  . Cellulitis of axilla, right 03/15/2014  . Pregnant and not yet delivered in second trimester - [facility] 06/04/2014 03/15/2014    Chief Complaint  Patient presents with  . Decreased Fetal Movement   HPI See above OB History   Grav Para Term Preterm Abortions TAB SAB Ect Mult Living   1         0      Past Medical History  Diagnosis Date  . UTI (lower urinary tract infection)   . Anxiety     never treated  . Medical history non-contributory     Past Surgical History  Procedure Laterality Date  . No past surgeries      Family History  Problem Relation Age of Onset  . Cancer Father     melanoma    History  Substance Use Topics  . Smoking status: Never Smoker   . Smokeless tobacco: Never Used  . Alcohol Use: No    Allergies: No Known Allergies  No prescriptions prior to admission    ROS Decreased FM  Physical Exam   Last menstrual period 08/28/2013.  Physical Exam  ED Course  Assessment: BPP 8/8 Yeast vaginitis (currently receiving treatment)  Plan: Results of BPP shared w/ pt and significant other. Explained again that anterior placenta may be reason she is not perceiving appropriate number of fetal movements. Spent 10 min discussing with pt and significant other how to perform kick counts and the required number each day. Detailed  instructions were given to pt the last time she was in MAU, along with a print out for recording her kicks. Today, it was was re-explained in the presence of FOB. Explained that the use of Procardia is not the reason she is not getting the appropriate number of kicks. Strict PTL precautions.  Pelvic rest/hydration.  Keep next scheduled appt on 04/24/14.    Farrel Gordon CNM, MS 04/17/2014 8:38 PM

## 2014-04-17 NOTE — MAU Note (Signed)
Called to take pt to [place], not in lobby. Called pt's cell phone - pt did not answer.  [place]- CNM on unit, aware

## 2014-04-19 ENCOUNTER — Encounter (HOSPITAL_COMMUNITY): Payer: Self-pay | Admitting: *Deleted

## 2014-04-19 ENCOUNTER — Emergency Department (INDEPENDENT_AMBULATORY_CARE_PROVIDER_SITE_OTHER)
Admission: EM | Admit: 2014-04-19 | Discharge: 2014-04-19 | Disposition: A | Discharge status: Home / Self Care | Payer: Medicaid Other | Source: Home / Self Care | Attending: Family Medicine | Admitting: Family Medicine

## 2014-04-19 ENCOUNTER — Inpatient Hospital Stay (HOSPITAL_COMMUNITY)
Admission: AD | Admit: 2014-04-19 | Discharge: 2014-04-19 | Disposition: A | Discharge status: Home / Self Care | Payer: Medicaid Other | Source: Ambulatory Visit | Attending: Obstetrics and Gynecology | Admitting: Obstetrics and Gynecology

## 2014-04-19 ENCOUNTER — Encounter (HOSPITAL_COMMUNITY): Payer: Self-pay | Admitting: Family Medicine

## 2014-04-19 DIAGNOSIS — Z3493 Encounter for supervision of normal pregnancy, unspecified, third trimester: Secondary | ICD-10-CM

## 2014-04-19 DIAGNOSIS — O212 Late vomiting of pregnancy: Secondary | ICD-10-CM | POA: Diagnosis not present

## 2014-04-19 DIAGNOSIS — O99891 Other specified diseases and conditions complicating pregnancy: Secondary | ICD-10-CM | POA: Diagnosis present

## 2014-04-19 DIAGNOSIS — R51 Headache: Secondary | ICD-10-CM | POA: Diagnosis not present

## 2014-04-19 DIAGNOSIS — R519 Headache, unspecified: Secondary | ICD-10-CM

## 2014-04-19 DIAGNOSIS — O9989 Other specified diseases and conditions complicating pregnancy, childbirth and the puerperium: Principal | ICD-10-CM

## 2014-04-19 DIAGNOSIS — R209 Unspecified disturbances of skin sensation: Secondary | ICD-10-CM | POA: Insufficient documentation

## 2014-04-19 DIAGNOSIS — Z331 Pregnant state, incidental: Secondary | ICD-10-CM | POA: Diagnosis not present

## 2014-04-19 LAB — URINALYSIS, ROUTINE W REFLEX MICROSCOPIC
Bilirubin Urine: NEGATIVE
Glucose, UA: NEGATIVE mg/dL
Hgb urine dipstick: NEGATIVE
KETONES UR: NEGATIVE mg/dL
NITRITE: NEGATIVE
PROTEIN: NEGATIVE mg/dL
Specific Gravity, Urine: <1.005 — ABNORMAL LOW (ref 1.005–1.030)
UROBILINOGEN UA: 1 mg/dL (ref 0.0–1.0)
pH: 6.5 (ref 5.0–8.0)

## 2014-04-19 LAB — URINE MICROSCOPIC-ADD ON

## 2014-04-19 MED ORDER — ACETAMINOPHEN 325 MG PO TABS
650.0000 mg | ORAL_TABLET | Freq: Once | ORAL | Status: AC
  Administered 2014-04-19: 650 mg via ORAL
  Filled 2014-04-19: qty 2

## 2014-04-19 NOTE — Discharge Instructions (Signed)
Third Trimester of Pregnancy The third trimester is from week 29 through week 42, months 7 through 9. The third trimester is a time when the fetus is growing rapidly. At the end of the ninth month, the fetus is about 20 inches in length and weighs 6-10 pounds.  BODY CHANGES Your body goes through many changes during pregnancy. The changes vary from woman to woman.   Your weight will continue to increase. You can expect to gain 25-35 pounds (11-16 kg) by the end of the pregnancy.  You may begin to get stretch marks on your hips, abdomen, and breasts.  You may urinate more often because the fetus is moving lower into your pelvis and pressing on your bladder.  You may develop or continue to have heartburn as a result of your pregnancy.  You may develop constipation because certain hormones are causing the muscles that push waste through your intestines to slow down.  You may develop hemorrhoids or swollen, bulging veins (varicose veins).  You may have pelvic pain because of the weight gain and pregnancy hormones relaxing your joints between the bones in your pelvis. Backaches may result from overexertion of the muscles supporting your posture.  You may have changes in your hair. These can include thickening of your hair, rapid growth, and changes in texture. Some women also have hair loss during or after pregnancy, or hair that feels dry or thin. Your hair will most likely return to normal after your baby is born.  Your breasts will continue to grow and be tender. A yellow discharge may leak from your breasts called colostrum.  Your belly button may stick out.  You may feel short of breath because of your expanding uterus.  You may notice the fetus "dropping," or moving lower in your abdomen.  You may have a bloody mucus discharge. This usually occurs a few days to a week before labor begins.  Your cervix becomes thin and soft (effaced) near your due date. WHAT TO EXPECT AT YOUR PRENATAL  EXAMS  You will have prenatal exams every 2 weeks until week 36. Then, you will have weekly prenatal exams. During a routine prenatal visit:  You will be weighed to make sure you and the fetus are growing normally.  Your blood pressure is taken.  Your abdomen will be measured to track your baby's growth.  The fetal heartbeat will be listened to.  Any test results from the previous visit will be discussed.  You may have a cervical check near your due date to see if you have effaced. At around 36 weeks, your caregiver will check your cervix. At the same time, your caregiver will also perform a test on the secretions of the vaginal tissue. This test is to determine if a type of bacteria, Group B streptococcus, is present. Your caregiver will explain this further. Your caregiver may ask you:  What your birth plan is.  How you are feeling.  If you are feeling the baby move.  If you have had any abnormal symptoms, such as leaking fluid, bleeding, severe headaches, or abdominal cramping.  If you have any questions. Other tests or screenings that may be performed during your third trimester include:  Blood tests that check for low iron levels (anemia).  Fetal testing to check the health, activity level, and growth of the fetus. Testing is done if you have certain medical conditions or if there are problems during the pregnancy. FALSE LABOR You may feel small, irregular contractions that  eventually go away. These are called Braxton Hicks contractions, or false labor. Contractions may last for hours, days, or even weeks before true labor sets in. If contractions come at regular intervals, intensify, or become painful, it is best to be seen by your caregiver.  SIGNS OF LABOR   Menstrual-like cramps.  Contractions that are 5 minutes apart or less.  Contractions that start on the top of the uterus and spread down to the lower abdomen and back.  A sense of increased pelvic pressure or back  pain.  A watery or bloody mucus discharge that comes from the vagina. If you have any of these signs before the 37th week of pregnancy, call your caregiver right away. You need to go to the hospital to get checked immediately. HOME CARE INSTRUCTIONS   Avoid all smoking, herbs, alcohol, and unprescribed drugs. These chemicals affect the formation and growth of the baby.  Follow your caregiver's instructions regarding medicine use. There are medicines that are either safe or unsafe to take during pregnancy.  Exercise only as directed by your caregiver. Experiencing uterine cramps is a good sign to stop exercising.  Continue to eat regular, healthy meals.  Wear a good support bra for breast tenderness.  Do not use hot tubs, steam rooms, or saunas.  Wear your seat belt at all times when driving.  Avoid raw meat, uncooked cheese, cat litter boxes, and soil used by cats. These carry germs that can cause birth defects in the baby.  Take your prenatal vitamins.  Try taking a stool softener (if your caregiver approves) if you develop constipation. Eat more high-fiber foods, such as fresh vegetables or fruit and whole grains. Drink plenty of fluids to keep your urine clear or pale yellow.  Take warm sitz baths to soothe any pain or discomfort caused by hemorrhoids. Use hemorrhoid cream if your caregiver approves.  If you develop varicose veins, wear support hose. Elevate your feet for 15 minutes, 3-4 times a day. Limit salt in your diet.  Avoid heavy lifting, wear low heal shoes, and practice good posture.  Rest a lot with your legs elevated if you have leg cramps or low back pain.  Visit your dentist if you have not gone during your pregnancy. Use a soft toothbrush to brush your teeth and be gentle when you floss.  A sexual relationship may be continued unless your caregiver directs you otherwise.  Do not travel far distances unless it is absolutely necessary and only with the approval  of your caregiver.  Take prenatal classes to understand, practice, and ask questions about the labor and delivery.  Make a trial run to the hospital.  Pack your hospital bag.  Prepare the baby's nursery.  Continue to go to all your prenatal visits as directed by your caregiver. SEEK MEDICAL CARE IF:  You are unsure if you are in labor or if your water has broken.  You have dizziness.  You have mild pelvic cramps, pelvic pressure, or nagging pain in your abdominal area.  You have persistent nausea, vomiting, or diarrhea.  You have a bad smelling vaginal discharge.  You have pain with urination. SEEK IMMEDIATE MEDICAL CARE IF:   You have a fever.  You are leaking fluid from your vagina.  You have spotting or bleeding from your vagina.  You have severe abdominal cramping or pain.  You have rapid weight loss or gain.  You have shortness of breath with chest pain.  You notice sudden or extreme swelling  of your face, hands, ankles, feet, or legs. °· You have not felt your baby move in over an hour. °· You have severe headaches that do not go away with medicine. °· You have vision changes. °Document Released: 09/02/2001 Document Revised: 09/13/2013 Document Reviewed: 11/09/2012 °ExitCare® Patient Information ©2015 [employer]. This information is not intended to replace advice given to you by your health care provider. Make sure you discuss any questions you have with your health care provider. °Braxton Hicks Contractions °Contractions of the uterus can occur throughout pregnancy. Contractions are not always a sign that you are in labor.  °WHAT ARE BRAXTON HICKS CONTRACTIONS?  °Contractions that occur before labor are called Braxton Hicks contractions, or false labor. Toward the end of pregnancy (32-34 weeks), these contractions can develop more often and may become more forceful. This is not true labor because these contractions do not result in opening (dilatation) and thinning of  the cervix. They are sometimes difficult to tell apart from true labor because these contractions can be forceful and people have different pain tolerances. You should not feel embarrassed if you go to the hospital with false labor. Sometimes, the only way to tell if you are in true labor is for your health care provider to look for changes in the cervix. °If there are no prenatal problems or other health problems associated with the pregnancy, it is completely safe to be sent home with false labor and await the onset of true labor. °HOW CAN YOU TELL THE DIFFERENCE BETWEEN TRUE AND FALSE LABOR? °False Labor °· The contractions of false labor are usually shorter and not as hard as those of true labor.   °· The contractions are usually irregular.   °· The contractions are often felt in the front of the lower abdomen and in the groin.   °· The contractions may go away when you walk around or change positions while lying down.   °· The contractions get weaker and are shorter lasting as time goes on.   °· The contractions do not usually become progressively stronger, regular, and closer together as with true labor.   °True Labor °· Contractions in true labor last 30-70 seconds, become very regular, usually become more intense, and increase in frequency.   °· The contractions do not go away with walking.   °· The discomfort is usually felt in the top of the uterus and spreads to the lower abdomen and low back.   °· True labor can be determined by your health care provider with an exam. This will show that the cervix is dilating and getting thinner.   °WHAT TO REMEMBER °· Keep up with your usual exercises and follow other instructions given by your health care provider.   °· Take medicines as directed by your health care provider.   °· Keep your regular prenatal appointments.   °· Eat and drink lightly if you think you are going into labor.   °· If Braxton Hicks contractions are making you uncomfortable:   °· Change your  position from lying down or resting to walking, or from walking to resting.   °· Sit and rest in a tub of warm water.   °· Drink 2-3 glasses of water. Dehydration may cause these contractions.   °· Do slow and deep breathing several times an hour.   °WHEN SHOULD I SEEK IMMEDIATE MEDICAL CARE? °Seek immediate medical care if: °· Your contractions become stronger, more regular, and closer together.   °· You have fluid leaking or gushing from your vagina.   °· You have a fever.   °· You pass blood-tinged mucus.   °·   You have vaginal bleeding.   °· You have continuous abdominal pain.   °· You have low back pain that you never had before.   °· You feel your baby's head pushing down and causing pelvic pressure.   °· Your baby is not moving as much as it used to.   °Document Released: 09/08/2005 Document Revised: 09/13/2013 Document Reviewed: 06/20/2013 °ExitCare® Patient Information ©2015 [employer]. This information is not intended to replace advice given to you by your health care provider. Make sure you discuss any questions you have with your health care provider. ° °

## 2014-04-19 NOTE — MAU Provider Note (Signed)
CNM for [facility] was delayed to MAU because of a delivery.   Patient states good fetal movement. She denies contractions, vaginal bleeding or LOF. She states complaint of headache without visual changes today. Tingling earlier in the right extremity only has resolved.   BP 114/72  Pulse 91  Temp(Src) 98.6 F (37 C) (Oral)  Resp 12  Ht 5\' 1"  (1.549 m)  Wt 152 lb (68.947 kg)  BMI 28.74 kg/m2  SpO2 99%  LMP 08/28/2013 GENERAL: Well-developed, well-nourished female in no acute distress.  HEENT: Normocephalic, atraumatic.   LUNGS: Effort normal HEART: Regular rate  SKIN: Warm, dry and without erythema PSYCH: Normal mood and affect  Medical screening exam complete  Farris Has, PA-C 04/19/2014 11:57 AM

## 2014-04-19 NOTE — Discharge Instructions (Signed)
Please drive straight to the MAU They are aware that your are coming You will likely need further mornitoring to ensure you and the baby are well.   Fetal Movement Counts Patient Name: __________________________________________________ Patient Due Date: ____________________ Performing a fetal movement count is highly recommended in high-risk pregnancies, but it is good for every pregnant woman to do. Your health care provider may ask you to start counting fetal movements at 28 weeks of the pregnancy. Fetal movements often increase:  After eating a full meal.  After physical activity.  After eating or drinking something sweet or cold.  At rest. Pay attention to when you feel the baby is most active. This will help you notice a pattern of your baby's sleep and wake cycles and what factors contribute to an increase in fetal movement. It is important to perform a fetal movement count at the same time each day when your baby is normally most active.  HOW TO COUNT FETAL MOVEMENTS 1. Find a quiet and comfortable area to sit or lie down on your left side. Lying on your left side provides the best blood and oxygen circulation to your baby. 2. Write down the day and time on a sheet of paper or in a journal. 3. Start counting kicks, flutters, swishes, rolls, or jabs in a 2-hour period. You should feel at least 10 movements within 2 hours. 4. If you do not feel 10 movements in 2 hours, wait 2-3 hours and count again. Look for a change in the pattern or not enough counts in 2 hours. SEEK MEDICAL CARE IF:  You feel less than 10 counts in 2 hours, tried twice.  There is no movement in over an hour.  The pattern is changing or taking longer each day to reach 10 counts in 2 hours.  You feel the baby is not moving as he or she usually does. Date: ____________ Movements: ____________ Start time: ____________ Elizebeth Koller time: ____________  Date: ____________ Movements: ____________ Start time: ____________  Elizebeth Koller time: ____________ Date: ____________ Movements: ____________ Start time: ____________ Elizebeth Koller time: ____________ Date: ____________ Movements: ____________ Start time: ____________ Elizebeth Koller time: ____________ Date: ____________ Movements: ____________ Start time: ____________ Elizebeth Koller time: ____________ Date: ____________ Movements: ____________ Start time: ____________ Elizebeth Koller time: ____________ Date: ____________ Movements: ____________ Start time: ____________ Elizebeth Koller time: ____________ Date: ____________ Movements: ____________ Start time: ____________ Elizebeth Koller time: ____________  Date: ____________ Movements: ____________ Start time: ____________ Elizebeth Koller time: ____________ Date: ____________ Movements: ____________ Start time: ____________ Elizebeth Koller time: ____________ Date: ____________ Movements: ____________ Start time: ____________ Elizebeth Koller time: ____________ Date: ____________ Movements: ____________ Start time: ____________ Elizebeth Koller time: ____________ Date: ____________ Movements: ____________ Start time: ____________ Elizebeth Koller time: ____________ Date: ____________ Movements: ____________ Start time: ____________ Elizebeth Koller time: ____________ Date: ____________ Movements: ____________ Start time: ____________ Elizebeth Koller time: ____________  Date: ____________ Movements: ____________ Start time: ____________ Elizebeth Koller time: ____________ Date: ____________ Movements: ____________ Start time: ____________ Elizebeth Koller time: ____________ Date: ____________ Movements: ____________ Start time: ____________ Elizebeth Koller time: ____________ Date: ____________ Movements: ____________ Start time: ____________ Elizebeth Koller time: ____________ Date: ____________ Movements: ____________ Start time: ____________ Elizebeth Koller time: ____________ Date: ____________ Movements: ____________ Start time: ____________ Elizebeth Koller time: ____________ Date: ____________ Movements: ____________ Start time: ____________ Elizebeth Koller time: ____________  Date: ____________  Movements: ____________ Start time: ____________ Elizebeth Koller time: ____________ Date: ____________ Movements: ____________ Start time: ____________ Elizebeth Koller time: ____________ Date: ____________ Movements: ____________ Start time: ____________ Elizebeth Koller time: ____________ Date: ____________ Movements: ____________ Start time: ____________ Elizebeth Koller time: ____________ Date: ____________ Movements: ____________ Start time: ____________ Elizebeth Koller time: ____________  Date: ____________ Movements: ____________ Start time: ____________ Elizebeth Koller time: ____________ Date: ____________ Movements: ____________ Start time: ____________ Elizebeth Koller time: ____________  Date: ____________ Movements: ____________ Start time: ____________ Elizebeth Koller time: ____________ Date: ____________ Movements: ____________ Start time: ____________ Elizebeth Koller time: ____________ Date: ____________ Movements: ____________ Start time: ____________ Elizebeth Koller time: ____________ Date: ____________ Movements: ____________ Start time: ____________ Elizebeth Koller time: ____________ Date: ____________ Movements: ____________ Start time: ____________ Elizebeth Koller time: ____________ Date: ____________ Movements: ____________ Start time: ____________ Elizebeth Koller time: ____________ Date: ____________ Movements: ____________ Start time: ____________ Elizebeth Koller time: ____________  Date: ____________ Movements: ____________ Start time: ____________ Elizebeth Koller time: ____________ Date: ____________ Movements: ____________ Start time: ____________ Elizebeth Koller time: ____________ Date: ____________ Movements: ____________ Start time: ____________ Elizebeth Koller time: ____________ Date: ____________ Movements: ____________ Start time: ____________ Elizebeth Koller time: ____________ Date: ____________ Movements: ____________ Start time: ____________ Elizebeth Koller time: ____________ Date: ____________ Movements: ____________ Start time: ____________ Elizebeth Koller time: ____________ Date: ____________ Movements: ____________ Start time:  ____________ Elizebeth Koller time: ____________  Date: ____________ Movements: ____________ Start time: ____________ Elizebeth Koller time: ____________ Date: ____________ Movements: ____________ Start time: ____________ Elizebeth Koller time: ____________ Date: ____________ Movements: ____________ Start time: ____________ Elizebeth Koller time: ____________ Date: ____________ Movements: ____________ Start time: ____________ Elizebeth Koller time: ____________ Date: ____________ Movements: ____________ Start time: ____________ Elizebeth Koller time: ____________ Date: ____________ Movements: ____________ Start time: ____________ Elizebeth Koller time: ____________ Date: ____________ Movements: ____________ Start time: ____________ Elizebeth Koller time: ____________  Date: ____________ Movements: ____________ Start time: ____________ Elizebeth Koller time: ____________ Date: ____________ Movements: ____________ Start time: ____________ Elizebeth Koller time: ____________ Date: ____________ Movements: ____________ Start time: ____________ Elizebeth Koller time: ____________ Date: ____________ Movements: ____________ Start time: ____________ Elizebeth Koller time: ____________ Date: ____________ Movements: ____________ Start time: ____________ Elizebeth Koller time: ____________ Date: ____________ Movements: ____________ Start time: ____________ Elizebeth Koller time: ____________ Document Released: 10/08/2006 Document Revised: 01/23/2014 Document Reviewed: 07/05/2012 [employer] Patient Information 2015 [employer]. This information is not intended to replace advice given to you by your health care provider. Make sure you discuss any questions you have with your health care provider.

## 2014-04-19 NOTE — MAU Provider Note (Signed)
History   17yo, G1P0 at [redacted]w[redacted]d was sent to MAU from [facility].  Pt reports yesterday evening numbness in arm and face, lose of strength in right arm for a moment, followed by a very bad HA and N/V.  Pt reports numbness this morning that quickly went away, HA has continued, and she has now been able to keep down a muffin and some poptarts.  Pt has a history of PTL, was given Procardia but stop taking around 30 weeks when she ran out, pt states that the Procardia "gave her a yeast infection."  Denies VB, UCs, recent fever, resp or GI c/o's, UTI s/s. GFM.    Patient Active Problem List   Diagnosis Date Noted  . Yeast vaginitis 04/17/2014  . Decreased fetal movement during pregnancy in third trimester, antepartum 04/10/2014  . Preterm uterine contractions in third trimester, antepartum 03/21/2014  . Cellulitis of axilla, right 03/15/2014  . Pregnant and not yet delivered in second trimester - [facility] 06/04/2014 03/15/2014    Chief Complaint  Patient presents with  . Headache   HPI  OB History   Grav Para Term Preterm Abortions TAB SAB Ect Mult Living   1         0      Past Medical History  Diagnosis Date  . UTI (lower urinary tract infection)   . Anxiety     never treated  . Medical history non-contributory     Past Surgical History  Procedure Laterality Date  . No past surgeries      Family History  Problem Relation Age of Onset  . Cancer Father     melanoma    History  Substance Use Topics  . Smoking status: Never Smoker   . Smokeless tobacco: Never Used  . Alcohol Use: No    Allergies: No Known Allergies  Prescriptions prior to admission  Medication Sig Dispense Refill  . NIFEdipine (PROCARDIA) 10 MG capsule Take 1 capsule (10 mg total) by mouth every 4 (four) hours as needed.  12 capsule  0  . Prenatal Vit-Fe Fumarate-FA (PRENATAL MULTIVITAMIN) TABS tablet Take 1 tablet by mouth daily at 12 noon.        ROS ROS: see HPI above, all other  systems are negative  Physical Exam   Blood pressure 114/72, pulse 91, temperature 98.6 F (37 C), temperature source Oral, resp. rate 12, height 5\' 1"  (1.549 m), weight 152 lb (68.947 kg), last menstrual period 08/28/2013, SpO2 99.00%.  Physical Exam Chest: Clear Heart: RRR Abdomen: gravid, NT Extremities: WNL   FHT:  Reactive NST UCs: Some occasional irritability  ED Course  SIUP at [redacted]w[redacted]d HA Numbness and reduction in muscle strength last night N/V last night   Tylenol 650 mg Once - with resolution  D/c home with Pre-e precautions F/u at already scheduled [facility] on 8/3     Linda Hedges CNM, MSN 04/19/2014 1:06 PM

## 2014-04-19 NOTE — ED Notes (Signed)
Pt reports numbness last night on right side of face and arm Lasted 20 minutes; also developed a HA that she still has and slurry speach Also reports vomiting since midnight; vomiting x15 days Pt is 33 weeks preg.  Alert w/no signs of acute distress.

## 2014-04-19 NOTE — MAU Note (Signed)
Pt states she was visiting someone at [facility] last night when she experience Rt arm weakness which made her drop her phone approx.  At 1900.  Slurred speak at 56.  Pt also experienced dizziness and a sudden headache which continues today.  Today she has been vomiting today and very nauseated.

## 2014-04-19 NOTE — ED Notes (Signed)
Dr. Marily Memos spoke to the charge nurse at [facility] Pt has been adv to go their immediately; verbalized understanding.

## 2014-04-19 NOTE — ED Provider Notes (Signed)
CSN: [id number]     Arrival date & time 04/19/14  1012 History   First MD Initiated Contact with Patient 04/19/14 1020     Chief Complaint  Patient presents with  . Numbness   (Consider location/radiation/quality/duration/timing/severity/associated sxs/prior Treatment) HPI  Pt is 33.[redacted]wks pregnant. Complaining of HA and R sided numbness and slurring of speech last night. Continues to have HA, but no longer w/ slurred speech and weakness or numbness. Regular fetal movement. Currently getting wkly NST and BPP. On procardia briefly but no longer. No contractions. Denies vaginal discharge, bleeding LOF. No RUQ pain.   Past Medical History  Diagnosis Date  . UTI (lower urinary tract infection)   . Anxiety     never treated  . Medical history non-contributory    Past Surgical History  Procedure Laterality Date  . No past surgeries     Family History  Problem Relation Age of Onset  . Cancer Father     melanoma   History  Substance Use Topics  . Smoking status: Never Smoker   . Smokeless tobacco: Never Used  . Alcohol Use: No   OB History   Grav Para Term Preterm Abortions TAB SAB Ect Mult Living   1         0     Review of Systems Per HPI with all other pertinent systems negative.   Allergies  Review of patient's allergies indicates no known allergies.  Home Medications   Prior to Admission medications   Medication Sig Start Date End Date Taking? Authorizing Provider  Prenatal Vit-Fe Fumarate-FA (PRENATAL MULTIVITAMIN) TABS tablet Take 1 tablet by mouth daily at 12 noon.   Yes Historical Provider, MD  NIFEdipine (PROCARDIA) 10 MG capsule Take 1 capsule (10 mg total) by mouth every 4 (four) hours as needed. 03/21/14   Farrel Gordon, CNM   BP 110/75  Pulse 82  Temp(Src) 98.2 F (36.8 C) (Oral)  Resp 17  SpO2 99%  LMP 08/28/2013 Physical Exam  Constitutional: She is oriented to person, place, and time. She appears well-developed and well-nourished. No distress.   HENT:  Head: Normocephalic and atraumatic.  Neck: Normal range of motion.  Cardiovascular: Normal rate.   Pulmonary/Chest: Effort normal and breath sounds normal.  Abdominal:  Soft, gravid, nonttp  Musculoskeletal: She exhibits no edema.  Neurological: She is alert and oriented to person, place, and time. No cranial nerve deficit. She exhibits normal muscle tone. Coordination normal.  Skin: Skin is warm. No rash noted. She is not diaphoretic.  Psychiatric: She has a normal mood and affect. Her behavior is normal. Judgment and thought content normal.    ED Course  Procedures (including critical care time) Labs Review Labs Reviewed - No data to display  Imaging Review US Fetal Bpp W/o Non Stress  04/18/2014   OBSTETRICAL ULTRASOUND: This exam was performed within a [facility]. The OB US report was generated in the [facility] system, and faxed to the ordering physician.   This report is available in the [facility]. See the [facility] Obstetric US report via the Image Link.    MDM   1. Nonintractable headache, unspecified chronicity pattern, unspecified headache type   2. Third trimester pregnancy    G1P0 at 33.5 wks w/ HA and reported (resolved L sided numbness and weaknes overnight). Pregnancy complicated by preterm labor adn intermittently on procardia. Reports close monitoring by OB w/ at least wkly BPP/NST and in office visits. Fetal HR 145 today on doppler. Exam  otherwise benign. No cervical check as this will be done at MAU Discussed case w/ triage nurse at MAU who have agreed to accept pt for fetal monitoring and further treatment. Likely benign process w/ regards to fetal and mother wellbeing but needs further monitoring in order to ensure their safety and wellbeing. Pt to travel to MAU by private vehicle. Aware of plan and will go immediately to MAU.  Linna Darner, MD Family Medicine 04/19/2014, 10:50 AM      Waldemar Dickens, MD 04/19/14 1050

## 2014-04-26 ENCOUNTER — Inpatient Hospital Stay (HOSPITAL_COMMUNITY)
Admission: AD | Admit: 2014-04-26 | Discharge: 2014-04-26 | Disposition: A | Discharge status: Home / Self Care | Payer: Medicaid Other | Source: Ambulatory Visit | Attending: Obstetrics and Gynecology | Admitting: Obstetrics and Gynecology

## 2014-04-26 DIAGNOSIS — R109 Unspecified abdominal pain: Secondary | ICD-10-CM | POA: Insufficient documentation

## 2014-04-26 DIAGNOSIS — O4703 False labor before 37 completed weeks of gestation, third trimester: Secondary | ICD-10-CM

## 2014-04-26 DIAGNOSIS — O99891 Other specified diseases and conditions complicating pregnancy: Secondary | ICD-10-CM | POA: Insufficient documentation

## 2014-04-26 DIAGNOSIS — O9989 Other specified diseases and conditions complicating pregnancy, childbirth and the puerperium: Principal | ICD-10-CM

## 2014-04-26 LAB — AMNISURE RUPTURE OF MEMBRANE (ROM) NOT AT ARMC: Amnisure ROM: NEGATIVE

## 2014-04-26 NOTE — MAU Note (Addendum)
Pt states she is having contractions and has been leaking fluid since this morning around 0600, called [facility] and was told to come in to [facility]U. Denies any bleeding.

## 2014-04-26 NOTE — MAU Note (Signed)
PT SAYS  SHE STARTED HAVING UC  SINCE 0600-      .THINKS  AT 0600-   WHILE IN BED-  FELT WET-  GOT UP TO URINATE-  AND COULD NOT STOP-  .   PT SAYS NO PAD ON - JUST IN UNDERWEAR - NONE IN PANTS.    NO VE IN OFFICE.    DENIES HSV AND MRSA.   CALLED [facility] AT 0800

## 2014-04-26 NOTE — Discharge Instructions (Signed)
Fetal Movement Counts °Patient Name: __________________________________________________ Patient Due Date: ____________________ °Performing a fetal movement count is highly recommended in high-risk pregnancies, but it is good for every pregnant woman to do. Your health care provider may ask you to start counting fetal movements at 28 weeks of the pregnancy. Fetal movements often increase: °· After eating a full meal. °· After physical activity. °· After eating or drinking something sweet or cold. °· At rest. °Pay attention to when you feel the baby is most active. This will help you notice a pattern of your baby's sleep and wake cycles and what factors contribute to an increase in fetal movement. It is important to perform a fetal movement count at the same time each day when your baby is normally most active.  °HOW TO COUNT FETAL MOVEMENTS °1. Find a quiet and comfortable area to sit or lie down on your left side. Lying on your left side provides the best blood and oxygen circulation to your baby. °2. Write down the day and time on a sheet of paper or in a journal. °3. Start counting kicks, flutters, swishes, rolls, or jabs in a 2-hour period. You should feel at least 10 movements within 2 hours. °4. If you do not feel 10 movements in 2 hours, wait 2-3 hours and count again. Look for a change in the pattern or not enough counts in 2 hours. °SEEK MEDICAL CARE IF: °· You feel less than 10 counts in 2 hours, tried twice. °· There is no movement in over an hour. °· The pattern is changing or taking longer each day to reach 10 counts in 2 hours. °· You feel the baby is not moving as he or she usually does. °Date: ____________ Movements: ____________ Start time: ____________ Finish time: ____________  °Date: ____________ Movements: ____________ Start time: ____________ Finish time: ____________ °Date: ____________ Movements: ____________ Start time: ____________ Finish time: ____________ °Date: ____________ Movements:  ____________ Start time: ____________ Finish time: ____________ °Date: ____________ Movements: ____________ Start time: ____________ Finish time: ____________ °Date: ____________ Movements: ____________ Start time: ____________ Finish time: ____________ °Date: ____________ Movements: ____________ Start time: ____________ Finish time: ____________ °Date: ____________ Movements: ____________ Start time: ____________ Finish time: ____________  °Date: ____________ Movements: ____________ Start time: ____________ Finish time: ____________ °Date: ____________ Movements: ____________ Start time: ____________ Finish time: ____________ °Date: ____________ Movements: ____________ Start time: ____________ Finish time: ____________ °Date: ____________ Movements: ____________ Start time: ____________ Finish time: ____________ °Date: ____________ Movements: ____________ Start time: ____________ Finish time: ____________ °Date: ____________ Movements: ____________ Start time: ____________ Finish time: ____________ °Date: ____________ Movements: ____________ Start time: ____________ Finish time: ____________  °Date: ____________ Movements: ____________ Start time: ____________ Finish time: ____________ °Date: ____________ Movements: ____________ Start time: ____________ Finish time: ____________ °Date: ____________ Movements: ____________ Start time: ____________ Finish time: ____________ °Date: ____________ Movements: ____________ Start time: ____________ Finish time: ____________ °Date: ____________ Movements: ____________ Start time: ____________ Finish time: ____________ °Date: ____________ Movements: ____________ Start time: ____________ Finish time: ____________ °Date: ____________ Movements: ____________ Start time: ____________ Finish time: ____________  °Date: ____________ Movements: ____________ Start time: ____________ Finish time: ____________ °Date: ____________ Movements: ____________ Start time: ____________ Finish  time: ____________ °Date: ____________ Movements: ____________ Start time: ____________ Finish time: ____________ °Date: ____________ Movements: ____________ Start time: ____________ Finish time: ____________ °Date: ____________ Movements: ____________ Start time: ____________ Finish time: ____________ °Date: ____________ Movements: ____________ Start time: ____________ Finish time: ____________ °Date: ____________ Movements: ____________ Start time: ____________ Finish time: ____________  °Date: ____________ Movements: ____________ Start time: ____________ Finish   time: ____________ °Date: ____________ Movements: ____________ Start time: ____________ Finish time: ____________ °Date: ____________ Movements: ____________ Start time: ____________ Finish time: ____________ °Date: ____________ Movements: ____________ Start time: ____________ Finish time: ____________ °Date: ____________ Movements: ____________ Start time: ____________ Finish time: ____________ °Date: ____________ Movements: ____________ Start time: ____________ Finish time: ____________ °Date: ____________ Movements: ____________ Start time: ____________ Finish time: ____________  °Date: ____________ Movements: ____________ Start time: ____________ Finish time: ____________ °Date: ____________ Movements: ____________ Start time: ____________ Finish time: ____________ °Date: ____________ Movements: ____________ Start time: ____________ Finish time: ____________ °Date: ____________ Movements: ____________ Start time: ____________ Finish time: ____________ °Date: ____________ Movements: ____________ Start time: ____________ Finish time: ____________ °Date: ____________ Movements: ____________ Start time: ____________ Finish time: ____________ °Date: ____________ Movements: ____________ Start time: ____________ Finish time: ____________  °Date: ____________ Movements: ____________ Start time: ____________ Finish time: ____________ °Date: ____________  Movements: ____________ Start time: ____________ Finish time: ____________ °Date: ____________ Movements: ____________ Start time: ____________ Finish time: ____________ °Date: ____________ Movements: ____________ Start time: ____________ Finish time: ____________ °Date: ____________ Movements: ____________ Start time: ____________ Finish time: ____________ °Date: ____________ Movements: ____________ Start time: ____________ Finish time: ____________ °Date: ____________ Movements: ____________ Start time: ____________ Finish time: ____________  °Date: ____________ Movements: ____________ Start time: ____________ Finish time: ____________ °Date: ____________ Movements: ____________ Start time: ____________ Finish time: ____________ °Date: ____________ Movements: ____________ Start time: ____________ Finish time: ____________ °Date: ____________ Movements: ____________ Start time: ____________ Finish time: ____________ °Date: ____________ Movements: ____________ Start time: ____________ Finish time: ____________ °Date: ____________ Movements: ____________ Start time: ____________ Finish time: ____________ °Document Released: 10/08/2006 Document Revised: 01/23/2014 Document Reviewed: 07/05/2012 °[employer]® Patient Information ©2015 [employer]. This information is not intended to replace advice given to you by your health care provider. Make sure you discuss any questions you have with your health care provider. °Braxton Hicks Contractions °Contractions of the uterus can occur throughout pregnancy. Contractions are not always a sign that you are in labor.  °WHAT ARE BRAXTON HICKS CONTRACTIONS?  °Contractions that occur before labor are called Braxton Hicks contractions, or false labor. Toward the end of pregnancy (32-34 weeks), these contractions can develop more often and may become more forceful. This is not true labor because these contractions do not result in opening (dilatation) and thinning of the cervix. They  are sometimes difficult to tell apart from true labor because these contractions can be forceful and people have different pain tolerances. You should not feel embarrassed if you go to the hospital with false labor. Sometimes, the only way to tell if you are in true labor is for your health care provider to look for changes in the cervix. °If there are no prenatal problems or other health problems associated with the pregnancy, it is completely safe to be sent home with false labor and await the onset of true labor. °HOW CAN YOU TELL THE DIFFERENCE BETWEEN TRUE AND FALSE LABOR? °False Labor °· The contractions of false labor are usually shorter and not as hard as those of true labor.   °· The contractions are usually irregular.   °· The contractions are often felt in the front of the lower abdomen and in the groin.   °· The contractions may go away when you walk around or change positions while lying down.   °· The contractions get weaker and are shorter lasting as time goes on.   °· The contractions do not usually become progressively stronger, regular, and closer together as with true labor.   °True Labor °· Contractions in true labor last 30-70 seconds, become   very regular, usually become more intense, and increase in frequency.   °· The contractions do not go away with walking.   °· The discomfort is usually felt in the top of the uterus and spreads to the lower abdomen and low back.   °· True labor can be determined by your health care provider with an exam. This will show that the cervix is dilating and getting thinner.   °WHAT TO REMEMBER °· Keep up with your usual exercises and follow other instructions given by your health care provider.   °· Take medicines as directed by your health care provider.   °· Keep your regular prenatal appointments.   °· Eat and drink lightly if you think you are going into labor.   °· If Braxton Hicks contractions are making you uncomfortable:   °¨ Change your position from  lying down or resting to walking, or from walking to resting.   °¨ Sit and rest in a tub of warm water.   °¨ Drink 2-3 glasses of water. Dehydration may cause these contractions.   °¨ Do slow and deep breathing several times an hour.   °WHEN SHOULD I SEEK IMMEDIATE MEDICAL CARE? °Seek immediate medical care if: °· Your contractions become stronger, more regular, and closer together.   °· You have fluid leaking or gushing from your vagina.   °· You have a fever.   °· You pass blood-tinged mucus.   °· You have vaginal bleeding.   °· You have continuous abdominal pain.   °· You have low back pain that you never had before.   °· You feel your baby's head pushing down and causing pelvic pressure.   °· Your baby is not moving as much as it used to.   °Document Released: 09/08/2005 Document Revised: 09/13/2013 Document Reviewed: 06/20/2013 °[employer]® Patient Information ©2015 [employer]. This information is not intended to replace advice given to you by your health care provider. Make sure you discuss any questions you have with your health care provider. ° °

## 2014-04-26 NOTE — [facility] Provider Note (Signed)
Kathleen Lloyd is a 18 y.o. G1 P0 at 34.3 weeks presents to [facility] c/o lower abd pain and a clear discharge since 0600this morning. She denies vb w/+FM.     History     Patient Active Problem List   Diagnosis Date Noted  . Yeast vaginitis 04/17/2014  . Decreased fetal movement during pregnancy in third trimester, antepartum 04/10/2014  . Preterm uterine contractions in third trimester, antepartum 03/21/2014  . Cellulitis of axilla, right 03/15/2014  . Pregnant and not yet delivered in second trimester - [facility] 06/04/2014 03/15/2014    Chief Complaint  Patient presents with  . Rupture of Membranes   HPI  OB History   Grav Para Term Preterm Abortions TAB SAB Ect Mult Living   1         0      Past Medical History  Diagnosis Date  . UTI (lower urinary tract infection)   . Anxiety     never treated  . Medical history non-contributory     Past Surgical History  Procedure Laterality Date  . No past surgeries      Family History  Problem Relation Age of Onset  . Cancer Father     melanoma    History  Substance Use Topics  . Smoking status: Never Smoker   . Smokeless tobacco: Never Used  . Alcohol Use: No    Allergies: No Known Allergies  Prescriptions prior to admission  Medication Sig Dispense Refill  . acetaminophen (TYLENOL) 325 MG tablet Take 325 mg by mouth every 6 (six) hours as needed for headache.      . Prenatal Vit-Fe Fumarate-FA (PRENATAL MULTIVITAMIN) TABS tablet Take 1 tablet by mouth daily at 12 noon.        ROS See HPI above, all other systems are negative  Physical Exam   Blood pressure 104/64, pulse 102, temperature 98.1 F (36.7 C), temperature source Oral, resp. rate 16, height 5\' 2"  (1.575 m), weight 156 lb (70.761 kg), last menstrual period 08/28/2013.  Physical Exam   ABD: Soft, non tender to palpation, no rebound or guarding SVE: C/T/H   ED Course  Assessment: IUP at  34.3weeks Membranes: RO ROM FHR: Category 1 CTX:   none  Plan:  [facility] Consult with Dr. Durward Fortes Yvonna Brun, CNM, MSN 04/26/2014. 10:36 AM   Addendment  Maryann Alar and amnisure negative DC to home with PTL and kick count instructions

## 2014-05-03 ENCOUNTER — Encounter (INDEPENDENT_AMBULATORY_CARE_PROVIDER_SITE_OTHER): Payer: Medicaid Other | Admitting: Surgery

## 2014-05-08 LAB — OB RESULTS CONSOLE GBS: GBS: POSITIVE

## 2014-05-15 ENCOUNTER — Encounter (INDEPENDENT_AMBULATORY_CARE_PROVIDER_SITE_OTHER): Payer: Self-pay | Admitting: Surgery

## 2014-05-31 ENCOUNTER — Inpatient Hospital Stay (HOSPITAL_COMMUNITY): Payer: Medicaid Other

## 2014-05-31 ENCOUNTER — Inpatient Hospital Stay (HOSPITAL_COMMUNITY)
Admission: AD | Admit: 2014-05-31 | Discharge: 2014-05-31 | Disposition: A | Discharge status: Home / Self Care | Payer: Medicaid Other | Source: Ambulatory Visit | Attending: Obstetrics and Gynecology | Admitting: Obstetrics and Gynecology

## 2014-05-31 ENCOUNTER — Encounter (HOSPITAL_COMMUNITY): Payer: Self-pay | Admitting: *Deleted

## 2014-05-31 DIAGNOSIS — O99891 Other specified diseases and conditions complicating pregnancy: Secondary | ICD-10-CM | POA: Diagnosis not present

## 2014-05-31 DIAGNOSIS — Z3492 Encounter for supervision of normal pregnancy, unspecified, second trimester: Secondary | ICD-10-CM

## 2014-05-31 DIAGNOSIS — O289 Unspecified abnormal findings on antenatal screening of mother: Secondary | ICD-10-CM

## 2014-05-31 DIAGNOSIS — O368131 Decreased fetal movements, third trimester, fetus 1: Secondary | ICD-10-CM

## 2014-05-31 DIAGNOSIS — O9989 Other specified diseases and conditions complicating pregnancy, childbirth and the puerperium: Principal | ICD-10-CM

## 2014-05-31 NOTE — Discharge Instructions (Signed)
Natural Childbirth Natural childbirth is going through labor and delivery without any drugs to relieve pain. You also do not use fetal monitors, have a cesarean delivery, or get a sugical cut to enlarge the vaginal opening (episiotomy). With the help of a birthing professional (midwife), you will direct your own labor and delivery as you choose. Many women chose natural childbirth because they feel more in control and in touch with their labor and delivery. They are also concerned about the medications affecting themselves and the baby. Pregnant women with a high risk pregnancy should not attempt natural childbirth. It is better to deliver the infant in a hospital if an emergency situation arises. Sometimes, the caregiver has to intervene for the health and safety of the mother and infant. TWO TECHNIQUES FOR NATURAL CHILDBIRTH:   The Lamaze method. This method teaches women that having a baby is normal, healthy, and natural. It also teaches the mother to take a neutral position regarding pain medication and anesthesia and to make an informed decision if and when it is right for them.  The Bradley method (also called husband coached birth). This method teaches the father to be the birth coach and stresses a natural approach. It also encourages exercise and a balanced diet with good nutrition. The exercises teach relaxation and deep breathing techniques. However, there are also classes to prepare the parents for an emergency situation that may occur. METHODS OF DEALING WITH LABOR PAIN AND DELIVERY:  Meditation.  Yoga.  Hypnosis.  Acupuncture.  Massage.  Changing positions (walking, rocking, showering, leaning on birth balls).  Lying in warm water or a jacuzzi.  Find an activity that keeps your mind off of the labor pain.  Listen to soft music.  Visual imagery (focus on a particular object). BEFORE GOING INTO LABOR  Be sure you and your spouse/partner are in agreement to have natural  childbirth.  Decide if your caregiver or a midwife will deliver your baby.  Decide if you will have your baby in the hospital, birthing center, or at home.  If you have children, make plans to have someone to take care of them when you go to the hospital.  Know the distance and the time it takes to go to the delivery center. Make a dry run to be sure.  Have a bag packed with a night gown, bathrobe, and toiletries ready to take when you go into labor.  Keep phone numbers of your family and friends handy if you need to call someone when you go into labor.  Your spouse or partner should go to all the teaching classes.  Talk with your caregiver about the possibility of a medical emergency and what will happen if that occurs. ADVANTAGES OF NATURAL CHILDBIRTH  You are in control of your labor and delivery.  It is safe.  There are no medications or anesthetics that may affect you and the fetus.  There are no invasive procedures such as an episiotomy.  You and your partner will work together, which can increase your bond.  Meditation, yoga, massage, and breathing exercises can be learned while pregnant and help you when you are in labor and at delivery.  In most delivery centers, the family and friends can be involved in the labor and delivery process. DISADVANTAGES OF NATURAL CHILDBIRTH  You will experience pain during your labor and delivery.  The methods of helping relieve your labor pains may not work for you.  You may feel embarrassed, disappointed, and like a failure   if you decide to change your mind during labor and not have natural childbirth. AFTER THE DELIVERY  You will be very tired.  You will be uncomfortable because of your uterus contracting. You will feel soreness around the vagina.  You may feel cold and shaky.This is a natural reaction.  You will be excited, overwhelmed, accomplished, and proud to be a mother. HOME CARE INSTRUCTIONS   Follow the advice and  instructions of your caregiver.  Follow the instructions of your natural childbirth instructor ([place]aze or [place]). Document Released: 08/21/2008 Document Revised: 12/01/2011 Document Reviewed: 05/16/2013 [facility] Patient Information 2015 [place], [place]. This information is not intended to replace advice given to you by your health care provider. Make sure you discuss any questions you have with your health care provider. Fetal Movement Counts Patient Name: __________________________________________________ Patient Due Date: ____________________ Performing a fetal movement count is highly recommended in high-risk pregnancies, but it is good for every pregnant woman to do. Your health care provider may ask you to start counting fetal movements at 28 weeks of the pregnancy. Fetal movements often increase:  After eating a full meal.  After physical activity.  After eating or drinking something sweet or cold.  At rest. Pay attention to when you feel the baby is most active. This will help you notice a pattern of your baby's sleep and wake cycles and what factors contribute to an increase in fetal movement. It is important to perform a fetal movement count at the same time each day when your baby is normally most active.  HOW TO COUNT FETAL MOVEMENTS 1. Find a quiet and comfortable area to sit or lie down on your left side. Lying on your left side provides the best blood and oxygen circulation to your baby. 2. Write down the day and time on a sheet of paper or in a journal. 3. Start counting kicks, flutters, swishes, rolls, or jabs in a 2-hour period. You should feel at least 10 movements within 2 hours. 4. If you do not feel 10 movements in 2 hours, wait 2-3 hours and count again. Look for a change in the pattern or not enough counts in 2 hours. SEEK MEDICAL CARE IF:  You feel less than 10 counts in 2 hours, tried twice.  There is no movement in over an hour.  The pattern is changing  or taking longer each day to reach 10 counts in 2 hours.  You feel the baby is not moving as he or she usually does. Date: ____________ Movements: ____________ Start time: ____________ Elizebeth Koller time: ____________  Date: ____________ Movements: ____________ Start time: ____________ Elizebeth Koller time: ____________ Date: ____________ Movements: ____________ Start time: ____________ Elizebeth Koller time: ____________ Date: ____________ Movements: ____________ Start time: ____________ Elizebeth Koller time: ____________ Date: ____________ Movements: ____________ Start time: ____________ Elizebeth Koller time: ____________ Date: ____________ Movements: ____________ Start time: ____________ Elizebeth Koller time: ____________ Date: ____________ Movements: ____________ Start time: ____________ Elizebeth Koller time: ____________ Date: ____________ Movements: ____________ Start time: ____________ Elizebeth Koller time: ____________  Date: ____________ Movements: ____________ Start time: ____________ Elizebeth Koller time: ____________ Date: ____________ Movements: ____________ Start time: ____________ Elizebeth Koller time: ____________ Date: ____________ Movements: ____________ Start time: ____________ Elizebeth Koller time: ____________ Date: ____________ Movements: ____________ Start time: ____________ Elizebeth Koller time: ____________ Date: ____________ Movements: ____________ Start time: ____________ Elizebeth Koller time: ____________ Date: ____________ Movements: ____________ Start time: ____________ Elizebeth Koller time: ____________ Date: ____________ Movements: ____________ Start time: ____________ Elizebeth Koller time: ____________  Date: ____________ Movements: ____________ Start time: ____________ Elizebeth Koller time: ____________ Date: ____________ Movements: ____________ Start time:  ____________ Elizebeth Koller time: ____________ Date: ____________ Movements: ____________ Start time: ____________ Elizebeth Koller time: ____________ Date: ____________ Movements: ____________ Start time: ____________ Elizebeth Koller time: ____________ Date: ____________  Movements: ____________ Start time: ____________ Elizebeth Koller time: ____________ Date: ____________ Movements: ____________ Start time: ____________ Elizebeth Koller time: ____________ Date: ____________ Movements: ____________ Start time: ____________ Elizebeth Koller time: ____________  Date: ____________ Movements: ____________ Start time: ____________ Elizebeth Koller time: ____________ Date: ____________ Movements: ____________ Start time: ____________ Elizebeth Koller time: ____________ Date: ____________ Movements: ____________ Start time: ____________ Elizebeth Koller time: ____________ Date: ____________ Movements: ____________ Start time: ____________ Elizebeth Koller time: ____________ Date: ____________ Movements: ____________ Start time: ____________ Elizebeth Koller time: ____________ Date: ____________ Movements: ____________ Start time: ____________ Elizebeth Koller time: ____________ Date: ____________ Movements: ____________ Start time: ____________ Elizebeth Koller time: ____________  Date: ____________ Movements: ____________ Start time: ____________ Elizebeth Koller time: ____________ Date: ____________ Movements: ____________ Start time: ____________ Elizebeth Koller time: ____________ Date: ____________ Movements: ____________ Start time: ____________ Elizebeth Koller time: ____________ Date: ____________ Movements: ____________ Start time: ____________ Elizebeth Koller time: ____________ Date: ____________ Movements: ____________ Start time: ____________ Elizebeth Koller time: ____________ Date: ____________ Movements: ____________ Start time: ____________ Elizebeth Koller time: ____________ Date: ____________ Movements: ____________ Start time: ____________ Elizebeth Koller time: ____________  Date: ____________ Movements: ____________ Start time: ____________ Elizebeth Koller time: ____________ Date: ____________ Movements: ____________ Start time: ____________ Elizebeth Koller time: ____________ Date: ____________ Movements: ____________ Start time: ____________ Elizebeth Koller time: ____________ Date: ____________ Movements: ____________ Start time:  ____________ Elizebeth Koller time: ____________ Date: ____________ Movements: ____________ Start time: ____________ Elizebeth Koller time: ____________ Date: ____________ Movements: ____________ Start time: ____________ Elizebeth Koller time: ____________ Date: ____________ Movements: ____________ Start time: ____________ Elizebeth Koller time: ____________  Date: ____________ Movements: ____________ Start time: ____________ Elizebeth Koller time: ____________ Date: ____________ Movements: ____________ Start time: ____________ Elizebeth Koller time: ____________ Date: ____________ Movements: ____________ Start time: ____________ Elizebeth Koller time: ____________ Date: ____________ Movements: ____________ Start time: ____________ Elizebeth Koller time: ____________ Date: ____________ Movements: ____________ Start time: ____________ Elizebeth Koller time: ____________ Date: ____________ Movements: ____________ Start time: ____________ Elizebeth Koller time: ____________ Date: ____________ Movements: ____________ Start time: ____________ Elizebeth Koller time: ____________  Date: ____________ Movements: ____________ Start time: ____________ Elizebeth Koller time: ____________ Date: ____________ Movements: ____________ Start time: ____________ Elizebeth Koller time: ____________ Date: ____________ Movements: ____________ Start time: ____________ Elizebeth Koller time: ____________ Date: ____________ Movements: ____________ Start time: ____________ Elizebeth Koller time: ____________ Date: ____________ Movements: ____________ Start time: ____________ Elizebeth Koller time: ____________ Date: ____________ Movements: ____________ Start time: ____________ Elizebeth Koller time: ____________ Document Released: 10/08/2006 Document Revised: 01/23/2014 Document Reviewed: 07/05/2012 [employer] Patient Information 2015 [place]. This information is not intended to replace advice given to you by your health care provider. Make sure you discuss any questions you have with your health care provider.

## 2014-05-31 NOTE — MAU Note (Signed)
Patient states she is having contractions about every 10 minutes with pressure and bloody show. Had membranes stripped in the office yesterday. Reports good fetal movement.

## 2014-05-31 NOTE — MAU Provider Note (Signed)
[place] BPP 8/8, AFI 16.08, FHR 130  Consulted with Dr. Cletis Media DC to home  FU in the office at next [place] precautions Kick counts   Sibley Rolison, CNM, MSN 05/31/2014. 1:10 PM

## 2014-05-31 NOTE — MAU Provider Note (Signed)
Kathleen Lloyd is a 18 y.o. G1P0 at 39.3 weeks presents to MAU SP ROB yesterday with membrane stripping.  She c/o spotting yesterday right after the VE, VB an hour later and again this morning.  She also c/o decrease fetal movement last night but felt movement this morning.  Denies lof   History     Patient Active Problem List   Diagnosis Date Noted  . Yeast vaginitis 04/17/2014  . Decreased fetal movement during pregnancy in third trimester, antepartum 04/10/2014  . Preterm uterine contractions in third trimester, antepartum 03/21/2014  . Cellulitis of axilla, right 03/15/2014  . Pregnant and not yet delivered in second trimester - [facility] 06/04/2014 03/15/2014    Chief Complaint  Patient presents with  . Labor Eval   HPI  OB History   Grav Para Term Preterm Abortions TAB SAB Ect Mult Living   1         0      Past Medical History  Diagnosis Date  . UTI (lower urinary tract infection)   . Anxiety     never treated  . Medical history non-contributory     Past Surgical History  Procedure Laterality Date  . No past surgeries      Family History  Problem Relation Age of Onset  . Cancer Father     melanoma    History  Substance Use Topics  . Smoking status: Never Smoker   . Smokeless tobacco: Never Used  . Alcohol Use: No    Allergies: No Known Allergies  Prescriptions prior to admission  Medication Sig Dispense Refill  . Prenatal Vit-Fe Fumarate-FA (PRENATAL MULTIVITAMIN) TABS tablet Take 1 tablet by mouth daily at 12 noon.        ROS See HPI above, all other systems are negative  Physical Exam   Height 5\' 2"  (1.575 m), weight 70.035 kg (154 lb 6.4 oz), last menstrual period 08/28/2013.  Physical Exam  Ext:  WNL ABD: Soft, non tender to palpation, no rebound or guarding SVE: 1-2/50/-2   ED Course  Assessment: IUP at  39.3weeks Membranes: intact FHR:  CTX:   No bleeding observed after this VE   Plan: [place] - BPP & AFI NST   Malarie Tappen, CNM,  MSN 05/31/2014. 11:43 AM

## 2014-06-06 ENCOUNTER — Encounter (HOSPITAL_COMMUNITY): Payer: Self-pay | Admitting: *Deleted

## 2014-06-06 ENCOUNTER — Telehealth (HOSPITAL_COMMUNITY): Payer: Self-pay | Admitting: *Deleted

## 2014-06-06 NOTE — Telephone Encounter (Signed)
Preadmission screen  

## 2014-06-07 ENCOUNTER — Other Ambulatory Visit (INDEPENDENT_AMBULATORY_CARE_PROVIDER_SITE_OTHER): Payer: Self-pay | Admitting: Surgery

## 2014-06-07 NOTE — H&P (Signed)
Kathleen Lloyd 06/07/2014 12:09 PM Location: [facility] Patient #: [id number] DOB: [birth year] Single / Language: [name] / Race: White Female  History of Present Illness Adin Hector MD; 06/07/2014 12:56 PM) Patient words: f/u from last office visit right axillary pain swelling.  The patient is a 18 year old female who presents to discuss consultation. This patient is a 18 y.o.female who presents today for surgical evaluation at the request of Farrel Gordon, CNM / Dr Cletis Media. Reason for visit: Right axillary pain swelling [place] female. In her third trimester pregnancy. Followed by [facility]. Noted swelling in her right armpit [place]. Had similar episode 3 years ago. Thought to be infection. Improved with oral doxycycline antibiotics. Went to the emergency room at [facility]. Improved. Noted swelling to physicians there. Surgical consultation recommended. She denies any history infections elsewhere. No MRSA. No falls or trauma. No hydradenitis. Denies any nipple discharge. Mild sensitivity was pregnancy but not severe. No history of breast masses. No history of fall or trauma. Denies any night sweats. No fevers chills or sweats. She comes in today with her future stepson. I saw June 2015. Recommended followup in a month. She was bedridden. She felt like it went down. However, she feels like it has gotten larger. Still sensitive to touch. She is overdue to deliver a child this week. Wished to be seen and see if something needed to happen.   Other Problems Lars Mage [place], MA; 06/07/2014 12:09 PM) Depression  Past Surgical History Lars Mage [place], [place]; 06/07/2014 12:09 PM) No pertinent past surgical history  Diagnostic Studies History Lars Mage [place], [place]; 06/07/2014 12:09 PM) Colonoscopy never Mammogram never Pap Smear never  Allergies [name], [place]; 06/07/2014 12:11 PM) No Known Drug Allergies09/16/2015  Medication History  ([name], MA; 06/07/2014 12:12 PM) Prenatal Vitamins (Oral daily) Active. Medications Reconciled  Social History Lars Mage [place], [place]; 06/07/2014 12:09 PM) Caffeine use Carbonated beverages, Tea. No alcohol use No drug use Tobacco use Former smoker.  Family History Lars Mage [place], [place]; 06/07/2014 12:09 PM) Melanoma Father. Migraine Headache Father.  Pregnancy / Birth History Lars Mage [place], [place]; 06/07/2014 12:09 PM) Age at menarche 73 years. Contraceptive History Oral contraceptives. Gravida 1 Maternal age 72-20 Para 0 Regular periods  Review of Systems Lars Mage [place]; 06/07/2014 12:09 PM) General Not Present- Appetite Loss, Chills, Fatigue, Fever, Night Sweats, Weight Gain and Weight Loss. HEENT Present- Seasonal Allergies and Wears glasses/contact lenses. Not Present- Earache, Hearing Loss, Hoarseness, Nose Bleed, Oral Ulcers, Ringing in the Ears, Sinus Pain, Sore Throat, Visual Disturbances and Yellow Eyes. Respiratory Not Present- Bloody sputum, Chronic Cough, Difficulty Breathing, Snoring and Wheezing. Breast Not Present- Breast Mass, Breast Pain, Nipple Discharge and Skin Changes. Cardiovascular Not Present- Chest Pain, Difficulty Breathing Lying Down, Leg Cramps, Palpitations, Rapid Heart Rate, Shortness of Breath and Swelling of Extremities. Female Genitourinary Not Present- Frequency, Nocturia, Painful Urination, Pelvic Pain and Urgency. Musculoskeletal Not Present- Back Pain, Joint Pain, Joint Stiffness, Muscle Pain, Muscle Weakness and Swelling of Extremities. Neurological Not Present- Decreased Memory, Fainting, Headaches, Numbness, Seizures, Tingling, Tremor, Trouble walking and Weakness. Psychiatric Present- Depression. Not Present- Anxiety, Bipolar, Change in Sleep Pattern, Fearful and Frequent crying. Endocrine Not Present- Cold Intolerance, Excessive Hunger, Hair Changes, Heat Intolerance, Hot flashes and New Diabetes. Hematology Present-  Gland problems. Not Present- Easy Bruising, Excessive bleeding, HIV and Persistent Infections.   Vitals ([name] MA; 06/07/2014 12:10 PM) 06/07/2014 12:10 PM Weight: 155 lb Height: 61in Body Surface Area: 1.74 m Body Mass Index: 29.29  kg/m BP: 110/70 (Sitting, Left Arm, Standard)    Physical Exam Adin Hector MD; 06/07/2014 12:32 PM) General Mental Status-Alert. General Appearance-Not in acute distress. Voice-Normal.  Integumentary Global Assessment Upon inspection and palpation of skin surfaces of the - Distribution of scalp and body hair is normal. General Characteristics Overall examination of the patient's skin reveals - no rashes and no suspicious lesions. Note: 7x5cm soft ellipsoid mass in the axilla. Larger but definitely softer. Not fluctuant. Mildly tender.   Head and Neck Head-normocephalic, atraumatic with no lesions or palpable masses. Face Global Assessment - atraumatic, no absence of expression. Neck Global Assessment - no abnormal movements, no decreased range of motion. Trachea-midline. Thyroid Gland Characteristics - non-tender.  Eye Eyeball - Left-Extraocular movements intact, No Nystagmus. Eyeball - Right-Extraocular movements intact, No Nystagmus. Upper Eyelid - Left-No Cyanotic. Upper Eyelid - Right-No Cyanotic.  Chest and Lung Exam Inspection Accessory muscles - No use of accessory muscles in breathing.  Abdomen Note: Gravid term pregnancy. probable small umbilical hernia. Reducible   Peripheral Vascular Upper Extremity Inspection - Left - Not Gangrenous, No Petechiae. Right - Not Gangrenous, No Petechiae.  Neurologic Neurologic evaluation reveals -normal attention span and ability to concentrate, able to name objects and repeat phrases. Appropriate fund of knowledge and normal coordination.  Neuropsychiatric Mental status exam performed with findings of-able to articulate well with normal  speech/language, rate, volume and coherence and no evidence of hallucinations, delusions, obsessions or homicidal/suicidal ideation. Orientation-oriented X3.  Musculoskeletal Global Assessment Gait and Station - normal gait and station.  Lymphatic General Lymphatics Description - No Generalized lymphadenopathy.    Assessment & Plan Adin Hector MD; 06/07/2014 12:29 PM) AXILLARY MASS, RIGHT (782.2  R22.31) Impression: Because the mass is larger and this persisted greater than 3 months, I feel this needs to be excised. It seems more soft to me like a lipoma. Lymph node or cyst less likely.  Because it has been going on for several months, I think it is safe to wait until after her delivery of baby is done (she is overdue already) and she has one-2 months to recover from that. I do not want to wait another 3-6 months. She agrees. Current Plans  Schedule for Surgery:  The pathophysiology of skin & subcutaneous masses was discussed. Natural history risks without surgery were discussed. I recommended surgery to remove the mass. I explained the technique of removal with use of local anesthesia & possible need for more aggressive sedation/anesthesia for patient comfort.  Risks such as bleeding, infection, heart attack, death, and other risks were discussed. I noted a good likelihood this will help address the problem. Possibility that this will not correct all symptoms was explained. Possibility of regrowth/recurrence of the mass was discussed. We will work to minimize complications. Questions were answered. The patient expresses understanding & wishes to proceed with surgery.   Signed by Adin Hector, MD (06/07/2014 12:57 PM)

## 2014-06-11 ENCOUNTER — Inpatient Hospital Stay (HOSPITAL_COMMUNITY)
Admission: AD | Admit: 2014-06-11 | Discharge: 2014-06-13 | DRG: 775 | Disposition: A | Discharge status: Home / Self Care | Payer: Medicaid Other | Source: Ambulatory Visit | Attending: Obstetrics and Gynecology | Admitting: Obstetrics and Gynecology

## 2014-06-11 ENCOUNTER — Inpatient Hospital Stay (HOSPITAL_COMMUNITY): Payer: Medicaid Other | Admitting: Anesthesiology

## 2014-06-11 ENCOUNTER — Encounter (HOSPITAL_COMMUNITY): Payer: Self-pay | Admitting: *Deleted

## 2014-06-11 ENCOUNTER — Encounter (HOSPITAL_COMMUNITY): Payer: Medicaid Other | Admitting: Anesthesiology

## 2014-06-11 DIAGNOSIS — Z22330 Carrier of Group B streptococcus: Secondary | ICD-10-CM

## 2014-06-11 DIAGNOSIS — O99344 Other mental disorders complicating childbirth: Secondary | ICD-10-CM | POA: Diagnosis present

## 2014-06-11 DIAGNOSIS — O479 False labor, unspecified: Secondary | ICD-10-CM | POA: Diagnosis present

## 2014-06-11 DIAGNOSIS — F3289 Other specified depressive episodes: Secondary | ICD-10-CM | POA: Diagnosis present

## 2014-06-11 DIAGNOSIS — F329 Major depressive disorder, single episode, unspecified: Secondary | ICD-10-CM | POA: Diagnosis present

## 2014-06-11 DIAGNOSIS — IMO0001 Reserved for inherently not codable concepts without codable children: Secondary | ICD-10-CM

## 2014-06-11 DIAGNOSIS — O9989 Other specified diseases and conditions complicating pregnancy, childbirth and the puerperium: Secondary | ICD-10-CM

## 2014-06-11 DIAGNOSIS — O99892 Other specified diseases and conditions complicating childbirth: Secondary | ICD-10-CM | POA: Diagnosis present

## 2014-06-11 DIAGNOSIS — B951 Streptococcus, group B, as the cause of diseases classified elsewhere: Secondary | ICD-10-CM | POA: Diagnosis present

## 2014-06-11 DIAGNOSIS — O480 Post-term pregnancy: Secondary | ICD-10-CM | POA: Diagnosis present

## 2014-06-11 LAB — CBC
HCT: 35.7 % — ABNORMAL LOW (ref 36.0–46.0)
Hemoglobin: 12.1 g/dL (ref 12.0–15.0)
MCH: 31.6 pg (ref 26.0–34.0)
MCHC: 33.9 g/dL (ref 30.0–36.0)
MCV: 93.2 fL (ref 78.0–100.0)
PLATELETS: 138 10*3/uL — AB (ref 150–400)
RBC: 3.83 MIL/uL — AB (ref 3.87–5.11)
RDW: 13.8 % (ref 11.5–15.5)
WBC: 14.5 10*3/uL — ABNORMAL HIGH (ref 4.0–10.5)

## 2014-06-11 LAB — RPR

## 2014-06-11 MED ORDER — LACTATED RINGERS IV SOLN: 500.0000 mL | Freq: Once | INTRAVENOUS | Status: DC

## 2014-06-11 MED ORDER — CITRIC ACID-SODIUM CITRATE 334-500 MG/5ML PO SOLN
30.0000 mL | ORAL | Status: DC | PRN
  Filled 2014-06-11: qty 15

## 2014-06-11 MED ORDER — INFLUENZA VAC SPLIT QUAD 0.5 ML IM SUSY: 0.5000 mL | PREFILLED_SYRINGE | INTRAMUSCULAR | Status: DC

## 2014-06-11 MED ORDER — FENTANYL 2.5 MCG/ML BUPIVACAINE 1/10 % EPIDURAL INFUSION (WH - ANES)
INTRAMUSCULAR | Status: AC
  Filled 2014-06-11: qty 125

## 2014-06-11 MED ORDER — NALBUPHINE HCL 10 MG/ML IJ SOLN
10.0000 mg | INTRAMUSCULAR | Status: DC | PRN
  Administered 2014-06-11: 10 mg via INTRAVENOUS
  Filled 2014-06-11: qty 1

## 2014-06-11 MED ORDER — OXYTOCIN 40 UNITS IN LACTATED RINGERS INFUSION - SIMPLE MED
62.5000 mL/h | INTRAVENOUS | Status: DC
Start: 2014-06-11 — End: 2014-06-11
  Filled 2014-06-11: qty 1000

## 2014-06-11 MED ORDER — ONDANSETRON HCL 4 MG PO TABS: 4.0000 mg | ORAL_TABLET | ORAL | Status: DC | PRN

## 2014-06-11 MED ORDER — PHENYLEPHRINE 40 MCG/ML (10ML) SYRINGE FOR IV PUSH (FOR BLOOD PRESSURE SUPPORT)
PREFILLED_SYRINGE | INTRAVENOUS | Status: AC
  Filled 2014-06-11: qty 10

## 2014-06-11 MED ORDER — PHENYLEPHRINE 40 MCG/ML (10ML) SYRINGE FOR IV PUSH (FOR BLOOD PRESSURE SUPPORT)
80.0000 ug | PREFILLED_SYRINGE | INTRAVENOUS | Status: DC | PRN
  Filled 2014-06-11: qty 2

## 2014-06-11 MED ORDER — OXYCODONE-ACETAMINOPHEN 5-325 MG PO TABS: 1.0000 | ORAL_TABLET | ORAL | Status: DC | PRN

## 2014-06-11 MED ORDER — DIPHENHYDRAMINE HCL 25 MG PO CAPS: 25.0000 mg | ORAL_CAPSULE | Freq: Four times a day (QID) | ORAL | Status: DC | PRN

## 2014-06-11 MED ORDER — OXYCODONE-ACETAMINOPHEN 5-325 MG PO TABS: 2.0000 | ORAL_TABLET | ORAL | Status: DC | PRN

## 2014-06-11 MED ORDER — ZOLPIDEM TARTRATE 5 MG PO TABS: 5.0000 mg | ORAL_TABLET | Freq: Every evening | ORAL | Status: DC | PRN

## 2014-06-11 MED ORDER — LIDOCAINE HCL (PF) 1 % IJ SOLN
30.0000 mL | INTRAMUSCULAR | Status: DC | PRN
  Filled 2014-06-11: qty 30

## 2014-06-11 MED ORDER — CHLORHEXIDINE GLUCONATE 4 % EX LIQD
1.0000 "application " | Freq: Once | CUTANEOUS | Status: DC
  Filled 2014-06-11: qty 15

## 2014-06-11 MED ORDER — PRENATAL MULTIVITAMIN CH
1.0000 | ORAL_TABLET | Freq: Every day | ORAL | Status: DC
  Administered 2014-06-12: 1 via ORAL
  Filled 2014-06-11: qty 1

## 2014-06-11 MED ORDER — EPHEDRINE 5 MG/ML INJ
10.0000 mg | INTRAVENOUS | Status: DC | PRN
  Filled 2014-06-11: qty 2

## 2014-06-11 MED ORDER — OXYTOCIN BOLUS FROM INFUSION: 500.0000 mL | INTRAVENOUS | Status: DC

## 2014-06-11 MED ORDER — BENZOCAINE-MENTHOL 20-0.5 % EX AERO: 1.0000 "application " | INHALATION_SPRAY | CUTANEOUS | Status: DC | PRN

## 2014-06-11 MED ORDER — WITCH HAZEL-GLYCERIN EX PADS: 1.0000 "application " | MEDICATED_PAD | CUTANEOUS | Status: DC | PRN

## 2014-06-11 MED ORDER — FENTANYL 2.5 MCG/ML BUPIVACAINE 1/10 % EPIDURAL INFUSION (WH - ANES)
14.0000 mL/h | INTRAMUSCULAR | Status: DC | PRN
Start: 2014-06-11 — End: 2014-06-11
  Administered 2014-06-11: 14 mL/h via EPIDURAL
  Filled 2014-06-11: qty 125

## 2014-06-11 MED ORDER — DIBUCAINE 1 % RE OINT: 1.0000 "application " | TOPICAL_OINTMENT | RECTAL | Status: DC | PRN

## 2014-06-11 MED ORDER — LIDOCAINE HCL (PF) 1 % IJ SOLN
INTRAMUSCULAR | Status: DC | PRN
  Administered 2014-06-11 (×2): 5 mL

## 2014-06-11 MED ORDER — LACTATED RINGERS IV SOLN
INTRAVENOUS | Status: DC
  Administered 2014-06-11 (×3): via INTRAVENOUS

## 2014-06-11 MED ORDER — PENICILLIN G POTASSIUM 5000000 UNITS IJ SOLR
2.5000 10*6.[IU] | INTRAVENOUS | Status: DC
  Administered 2014-06-11 (×2): 2.5 10*6.[IU] via INTRAVENOUS
  Filled 2014-06-11 (×6): qty 2.5

## 2014-06-11 MED ORDER — CEFAZOLIN SODIUM-DEXTROSE 2-3 GM-% IV SOLR
2.0000 g | INTRAVENOUS | Status: DC
Start: 2014-06-11 — End: 2014-06-11
  Filled 2014-06-11: qty 50

## 2014-06-11 MED ORDER — TETANUS-DIPHTH-ACELL PERTUSSIS 5-2.5-18.5 LF-MCG/0.5 IM SUSP: 0.5000 mL | Freq: Once | INTRAMUSCULAR | Status: DC

## 2014-06-11 MED ORDER — DIPHENHYDRAMINE HCL 50 MG/ML IJ SOLN: 12.5000 mg | INTRAMUSCULAR | Status: DC | PRN

## 2014-06-11 MED ORDER — ACETAMINOPHEN 325 MG PO TABS: 650.0000 mg | ORAL_TABLET | ORAL | Status: DC | PRN

## 2014-06-11 MED ORDER — SIMETHICONE 80 MG PO CHEW: 80.0000 mg | CHEWABLE_TABLET | ORAL | Status: DC | PRN

## 2014-06-11 MED ORDER — SENNOSIDES-DOCUSATE SODIUM 8.6-50 MG PO TABS
2.0000 | ORAL_TABLET | ORAL | Status: DC
  Administered 2014-06-11: 2 via ORAL
  Filled 2014-06-11 (×2): qty 2

## 2014-06-11 MED ORDER — MISOPROSTOL 25 MCG QUARTER TABLET
25.0000 ug | ORAL_TABLET | ORAL | Status: DC | PRN
  Filled 2014-06-11: qty 1

## 2014-06-11 MED ORDER — LACTATED RINGERS IV SOLN
500.0000 mL | INTRAVENOUS | Status: DC | PRN
  Administered 2014-06-11: 500 mL via INTRAVENOUS

## 2014-06-11 MED ORDER — CHLORHEXIDINE GLUCONATE 4 % EX LIQD
1.0000 | Freq: Once | CUTANEOUS | Status: DC
Start: 2014-06-11 — End: 2014-06-11
  Filled 2014-06-11: qty 15

## 2014-06-11 MED ORDER — TERBUTALINE SULFATE 1 MG/ML IJ SOLN
INTRAMUSCULAR | Status: AC
  Filled 2014-06-11: qty 1

## 2014-06-11 MED ORDER — OXYCODONE-ACETAMINOPHEN 5-325 MG PO TABS
1.0000 | ORAL_TABLET | ORAL | Status: DC | PRN
  Administered 2014-06-11 – 2014-06-12 (×3): 1 via ORAL
  Filled 2014-06-11 (×3): qty 1

## 2014-06-11 MED ORDER — IBUPROFEN 600 MG PO TABS
600.0000 mg | ORAL_TABLET | Freq: Four times a day (QID) | ORAL | Status: DC
Start: 2014-06-11 — End: 2014-06-13
  Administered 2014-06-11 – 2014-06-13 (×6): 600 mg via ORAL
  Filled 2014-06-11 (×7): qty 1

## 2014-06-11 MED ORDER — PENICILLIN G POTASSIUM 5000000 UNITS IJ SOLR
5.0000 10*6.[IU] | Freq: Once | INTRAVENOUS | Status: AC
  Administered 2014-06-11: 5 10*6.[IU] via INTRAVENOUS
  Filled 2014-06-11: qty 5

## 2014-06-11 MED ORDER — ONDANSETRON HCL 4 MG/2ML IJ SOLN: 4.0000 mg | INTRAMUSCULAR | Status: DC | PRN

## 2014-06-11 MED ORDER — FENTANYL 2.5 MCG/ML BUPIVACAINE 1/10 % EPIDURAL INFUSION (WH - ANES)
INTRAMUSCULAR | Status: DC | PRN
  Administered 2014-06-11: 05:00:00
  Administered 2014-06-11: 14 mL/h via EPIDURAL

## 2014-06-11 MED ORDER — ONDANSETRON HCL 4 MG/2ML IJ SOLN
4.0000 mg | Freq: Four times a day (QID) | INTRAMUSCULAR | Status: DC | PRN
Start: 2014-06-11 — End: 2014-06-11
  Administered 2014-06-11: 4 mg via INTRAVENOUS
  Filled 2014-06-11: qty 2

## 2014-06-11 MED ORDER — TERBUTALINE SULFATE 1 MG/ML IJ SOLN: 0.2500 mg | Freq: Once | INTRAMUSCULAR | Status: DC | PRN

## 2014-06-11 MED ORDER — LANOLIN HYDROUS EX OINT: TOPICAL_OINTMENT | CUTANEOUS | Status: DC | PRN

## 2014-06-11 MED ORDER — OXYTOCIN 40 UNITS IN LACTATED RINGERS INFUSION - SIMPLE MED
1.0000 m[IU]/min | INTRAVENOUS | Status: DC
  Administered 2014-06-11: 1 m[IU]/min via INTRAVENOUS

## 2014-06-11 NOTE — H&P (Signed)
Kathleen Lloyd is a 18 y.o. female, G1P0 at 10 weeks, presenting for active labor.  Patient states that she has been contracting, regularly, for about 3 hours.  Patient reports active fetus and VB and denies LOF. Patient to be induced tomorrow and desires epidural for pain mgmt.    Patient Active Problem List   Diagnosis Date Noted  . Yeast vaginitis 04/17/2014  . Decreased fetal movement during pregnancy in third trimester, antepartum 04/10/2014  . Preterm uterine contractions in third trimester, antepartum 03/21/2014  . Cellulitis of axilla, right 03/15/2014  . Pregnant and not yet delivered in second trimester - [facility] 06/04/2014 03/15/2014    History of present pregnancy: Patient entered care at 6.2 weeks.   EDC of 06/04/2014 was established by Definite LMP of 08/28/2014.   Anatomy scan:  20.2 weeks, with normal findings and an anterior placenta.   Additional [place] evaluations:  30.1wks S<D Growth US WNL: EFW lbs 12oz 57%tile, AFI 17.1, anterior placenta, no previa, cervix closed.   Significant prenatal events:  Patient suffered from common pregnancy complaints/conditions including N/V, SOB, Abdominal Pain, Constipationand 1st Trimester Bleeding.  Patient also suffered froma UTI, yeast infection, and bacterial vaginosis in early pregnancy.  Patient started on Procardia at 29wks for uterine contractions.  Patient with report of decreased fetal movement at 31, 32, and 33 wks but all BPP 8/8. Last evaluation: 06/05/2014 by A. Mancel Bale, MD---1-2/70/-2  OB History   Grav Para Term Preterm Abortions TAB SAB Ect Mult Living   1         0     Past Medical History  Diagnosis Date  . UTI (lower urinary tract infection)   . Anxiety     never treated  . Medical history non-contributory   . JIRCVELF(810.1)    Past Surgical History  Procedure Laterality Date  . No past surgeries     Family History: family history includes Cancer in her father. Social History:  reports that she has never smoked. She  has never used smokeless tobacco. She reports that she does not drink alcohol or use illicit drugs. Patient currently lives with father in law who was diagnosed with terminal metatasized CA. Patient with history of depression, but declined counseling.     Prenatal Transfer Tool  Maternal Diabetes: No Genetic Screening: Normal Maternal Ultrasounds/Referrals: Normal Fetal Ultrasounds or other Referrals:  None Maternal Substance Abuse:  No Significant Maternal Medications:  Meds include: Other: PNV Significant Maternal Lab Results: Lab values include: Group B Strep positive    ROS:  See HPI Above  No Known Allergies   Dilation: 4 Effacement (%): 80 Station: -1 Exam by:: Kathleen Lloyd Blood pressure 122/81, pulse 87, temperature 98 F (36.7 C), temperature source Oral, resp. rate 18, last menstrual period 08/28/2013, SpO2 100.00%.  Chest clear Heart RRR without murmur Abd gravid, NT Pelvic: See Above-Bloody Show Ext: WNL  FHR: .145 bpm, Mod Var, -Decels, +Accels UCs:  Q1-64min, palpates moderate  Prenatal labs: ABO, Rh: O/Positive/-- (01/20 0000) Antibody: Negative (01/20 0000) Rubella:   Immune RPR: Nonreactive (01/20 0000)  HBsAg: Negative (01/20 0000)  HIV: Non-reactive (01/20 0000)  GBS: Positive (08/17 0000) Sickle cell/Hgb electrophoresis:  N/A Pap:  N/A GC:  Negative Chlamydia:  Negative Genetic screenings:  Negative Glucola:  Negative Other:  TSH-Normal    Assessment IUP at 41wks Cat I FT Active Labor GBS Positive Desires Epidural H/O Depression  Plan: Admit to [facility] per consult with Kathleen Lloyd. [place]ard Routine Labor and Delivery Orders Start  PCN for GBS prophylaxis Okay for Epidural Social Work Consult   Kathleen Lloyd 06/11/2014, 3:29 AM

## 2014-06-11 NOTE — Anesthesia Preprocedure Evaluation (Signed)

## 2014-06-11 NOTE — MAU Note (Signed)
Pt reports contractions all day, bleeding all day.

## 2014-06-11 NOTE — Progress Notes (Signed)
Cortny Bambach MRN: [id number]  Subjective: -Patient comfortable after epidural.  Family at bedside, supportive.   Objective: BP 105/75  Pulse 78  Temp(Src) 98.4 F (36.9 C) (Oral)  Resp 18  Ht 5\' 1"  (1.549 m)  Wt 155 lb (70.308 kg)  BMI 29.30 kg/m2  SpO2 99%  LMP 08/28/2013     FHT:125 bpm, Mod Var, -Decels, +10x10 Accels UC:Q1-85min, palpates moderate    SVE:   Dilation: 4 Effacement (%): 80 Station: -1 Exam by:: Gavin Pound CNM Bloody Show Membranes:AROM: Clear, Moderate Amt Fluid Pitocin: None Epidural in place  Assessment:  IUP at 41wks Cat I FT  Active Labor Amniotomy  Plan: -Will update Dr. Chauncey Cruel. Rivard on patient progress -Continue other mgmt as ordered  Darien Ramus, MSN 06/11/2014, 6:13 AM

## 2014-06-11 NOTE — Anesthesia Procedure Notes (Signed)
Epidural Patient location during procedure: OB Start time: 06/11/2014 5:02 AM  Staffing Anesthesiologist: Rudean Curt Performed by: anesthesiologist   Preanesthetic Checklist Completed: patient identified, site marked, surgical consent, pre-op evaluation, timeout performed, IV checked, risks and benefits discussed and monitors and equipment checked  Epidural Patient position: sitting Prep: site prepped and draped and DuraPrep Patient monitoring: continuous pulse ox and blood pressure Approach: midline Location: L3-L4 Injection technique: LOR air  Needle:  Needle type: Tuohy  Needle gauge: 17 G Needle length: 9 cm and 9 Needle insertion depth: 5 cm cm Catheter type: closed end flexible Catheter size: 19 Gauge Catheter at skin depth: 10 cm Test dose: negative  Assessment Events: blood not aspirated, injection not painful, no injection resistance, negative IV test and no paresthesia  Additional Notes Patient identified.  Risk benefits discussed including failed block, incomplete pain control, headache, nerve damage, paralysis, blood pressure changes, nausea, vomiting, reactions to medication both toxic or allergic, and postpartum back pain.  Patient expressed understanding and wished to proceed.  All questions were answered.  Sterile technique used throughout procedure and epidural site dressed with sterile barrier dressing. No paresthesia or other complications noted.The patient did not experience any signs of intravascular injection such as tinnitus or metallic taste in mouth nor signs of intrathecal spread such as rapid motor block. Please see nursing notes for vital signs.

## 2014-06-11 NOTE — Progress Notes (Signed)
  Subjective: Called to room for decel, associated with episode of hyperstimulation.  Objective: BP 102/59  Pulse 70  Temp(Src) 98.4 F (36.9 C) (Oral)  Resp 18  Ht 5\' 1"  (1.549 m)  Wt 155 lb (70.308 kg)  BMI 29.30 kg/m2  SpO2 99%  LMP 08/28/2013      FHT: Category 2--decel x 1 to 60-90 x 5 min. O2 on, pit off, position change, VE with + scalp stim. Resolved to baseline of 140, decreased variability at present, O2 still in place, but negative spontaneous CST. UC:   regular, every 2 minutes SVE:   Dilation: 8 Effacement (%): 90 Station: +1 Exam by:: Kasen Sako, CNM Bloody show noted. Pitocin had been on 1 mu/min  Assessment:  Labor progress  Plan: Keep pitocin off at present. Continue close observation of FHR status.  Donnel Saxon CNM 06/11/2014, 10:12 AM

## 2014-06-11 NOTE — Progress Notes (Addendum)
  Subjective: Comfortable with epidural.  Vomited x 1--received Zofran with benefit.  Objective: BP 106/71  Pulse 90  Temp(Src) 98.4 F (36.9 C) (Oral)  Resp 18  Ht 5\' 1"  (1.549 m)  Wt 155 lb (70.308 kg)  BMI 29.30 kg/m2  SpO2 99%  LMP 08/28/2013      FHT: Category 1 UC:   regular, every 2 minutes SVE:   Dilation: 6 Effacement (%): 80 Station: -1 Exam by:: V.Melquan Ernsberger, CNM IUPC placed without difficulty. Small amount bloody show.  Assessment:  Progressive labor GBS positive  Plan: Observe for labor adequacy--augment prn.  Donnel Saxon CNM 06/11/2014, 8:53 AM

## 2014-06-11 NOTE — Progress Notes (Signed)
  Subjective: Some lower abdominal pain with UCs, but no pressure  Objective: BP 116/71  Pulse 109  Temp(Src) 98.4 F (36.9 C) (Oral)  Resp 18  Ht 5\' 1"  (1.549 m)  Wt 155 lb (70.308 kg)  BMI 29.30 kg/m2  SpO2 99%  LMP 08/28/2013      FHT: Category 1 UC:   regular, every 2-3 minutes SVE:   Dilation: 10 Effacement (%): 90 Station: +1 Exam by:: J.Thornton, RN No urge to push at present.  Assessment:  2nd stage labor  Plan: Will allow to labor down, await urge to push.  Donnel Saxon CNM 06/11/2014, 12:40 PM

## 2014-06-11 NOTE — Progress Notes (Signed)
  Subjective: Comfortable with epidural.  FOB in bed with patient.  Objective: BP 98/64  Pulse 81  Temp(Src) 98.4 F (36.9 C) (Oral)  Resp 18  Ht 5\' 1"  (1.549 m)  Wt 155 lb (70.308 kg)  BMI 29.30 kg/m2  SpO2 99%  LMP 08/28/2013      FHT: Category 1 UC:   regular, every 3-4 minutes SVE:   Dilation: 4 Effacement (%): 80 Station: -1 Exam by:: Gavin Pound CNM at [phone]   Assessment:  Early labor GBS positive  Plan: Continue to observe. Augment prn.  Donnel Saxon CNM 06/11/2014, 7:55 AM

## 2014-06-12 ENCOUNTER — Inpatient Hospital Stay (HOSPITAL_COMMUNITY): Admission: RE | Admit: 2014-06-12 | Payer: Medicaid Other | Source: Ambulatory Visit

## 2014-06-12 LAB — CBC
HEMATOCRIT: 30.4 % — AB (ref 36.0–46.0)
Hemoglobin: 10 g/dL — ABNORMAL LOW (ref 12.0–15.0)
MCH: 31.5 pg (ref 26.0–34.0)
MCHC: 32.9 g/dL (ref 30.0–36.0)
MCV: 95.9 fL (ref 78.0–100.0)
Platelets: 133 10*3/uL — ABNORMAL LOW (ref 150–400)
RBC: 3.17 MIL/uL — AB (ref 3.87–5.11)
RDW: 14.1 % (ref 11.5–15.5)
WBC: 13.4 10*3/uL — ABNORMAL HIGH (ref 4.0–10.5)

## 2014-06-12 NOTE — Lactation Note (Signed)
This note was copied from the chart of [place]. Lactation Consultation Note Follow up at 29 hours of age.  Baby last ate at 1400 and mom attempted about 30 minutes ago, baby was sleepy.  Assisted mom with hand pumping to evert nipples and mom returned demonstration of nipple shield application.  Several attempts and baby didn't latch well, mom used drops of collected colostrum to baby's mouth and baby began to show feeding cues.   Baby then maintain latch for greater than 15 minutes with good rhythmic sucking and jaw excursions.  Nipple shield was clear of colostrum at this observation.  DEBP set up with cleaning and storage guidelines discussed.  FOB asked questions about exclusively pumping.  Mom wants to continue to use nipple shield and may offer baby colostrum as appetizer prior to latching.  Mom to call for assist as needed.      Patient Name: Boy Beverely Suen RCVEL'F Date: 06/12/2014 Reason for consult: Follow-up assessment;Difficult latch   Maternal Data Has patient been taught Hand Expression?: Yes  Feeding Feeding Type: Breast Fed Length of feed: 15 min (observed)  LATCH Score/Interventions Latch: Grasps breast easily, tongue down, lips flanged, rhythmical sucking. Intervention(s): Assist with latch  Audible Swallowing: A few with stimulation Intervention(s): Skin to skin;Hand expression  Type of Nipple: Flat Intervention(s): Hand pump;Shells;Double electric pump  Comfort (Breast/Nipple): Soft / non-tender     Hold (Positioning): Assistance needed to correctly position infant at breast and maintain latch. Intervention(s): Skin to skin;Position options;Support Pillows;Breastfeeding basics reviewed  LATCH Score: 7  Lactation Tools Discussed/Used Tools: Nipple Shields Nipple shield size: 20 Pump Review: Setup, frequency, and cleaning Initiated by:: JS Date initiated:: 06/12/14   Consult Status Consult Status: Follow-up Date: 06/13/14 Follow-up type:  In-patient    Shoptaw, Justine Null 06/12/2014, 8:18 PM

## 2014-06-12 NOTE — Anesthesia Postprocedure Evaluation (Signed)
  Anesthesia Post-op Note  Patient: Kathleen Lloyd  Procedure(s) Performed: * No procedures listed *  Patient Location: PACU and Mother/Baby  Anesthesia Type:Epidural  Level of Consciousness: awake, alert  and oriented  Airway and Oxygen Therapy: Patient Spontanous Breathing  Post-op Pain: mild  Post-op Assessment: Post-op Vital signs reviewed, Patient's Cardiovascular Status Stable, Respiratory Function Stable, No signs of Nausea or vomiting, Adequate PO intake, Pain level controlled, No headache, No backache, No residual numbness and No residual motor weakness  Post-op Vital Signs: Reviewed and stable  Last Vitals:  Filed Vitals:   06/12/14 0551  BP: 101/57  Pulse: 73  Temp: 36.5 C  Resp: 18    Complications: No apparent anesthesia complications

## 2014-06-12 NOTE — Progress Notes (Signed)
Ur chart review completed.  

## 2014-06-12 NOTE — Plan of Care (Signed)
Problem: Discharge Progression Outcomes Goal: Barriers To Progression Addressed/Resolved Outcome: Completed/Met Date Met:  06/12/14 Given lactation support and education,  She is using nipple shield, double electric breast pump, and has had  Lactation consult with staff nurse.

## 2014-06-12 NOTE — Progress Notes (Addendum)
Subjective: Postpartum Day 1: Vaginal delivery, bilateral periurethral lacerations Patient up ad lib, reports no syncope or dizziness. Feeding:  Bottle feeding at present--having issues with breastfeeding.  Not sure of final decision. Contraceptive plan:  Considering Mirena  Objective: Vital signs in last 24 hours: Temp:  [97.6 F (36.4 C)-98.5 F (36.9 C)] 97.7 F (36.5 C) (09/21 0551) Pulse Rate:  [73-126] 73 (09/21 0551) Resp:  [16-18] 18 (09/21 0551) BP: (101-116)/(56-79) 101/57 mmHg (09/21 0551) SpO2:  [99 %] 99 % (09/21 0551)  Physical Exam:  General: alert Lochia: appropriate Uterine Fundus: firm Perineum: healing well DVT Evaluation: No evidence of DVT seen on physical exam. Negative Homan's sign.    Recent Labs  06/11/14 0415 06/12/14 0625  HGB 12.1 10.0*  HCT 35.7* 30.4*    Assessment/Plan: Status post vaginal delivery day 1. Stable Continue current care. Plan for discharge tomorrow    Allena Katz 06/12/2014, 9:19 AM

## 2014-06-12 NOTE — Lactation Note (Signed)
This note was copied from the chart of [place]. Lactation Consultation Note: initial visit with mom. She reports that she has had trouble getting the baby to latch and has been giving bottles of formula because he wasn't getting enough. Offered assist. Baby had formula about 1 hour ago and is asleep in bassinet. Mom states she is not sure if breast feeding is going to work for her. States she will keep trying. To call for assist at next feeding. BF brochure given with resources for support after DC.   Patient Name: Kathleen Lloyd YIRSW'N Date: 06/12/2014 Reason for consult: Initial assessment   Maternal Data Formula Feeding for Exclusion: No Does the patient have breastfeeding experience prior to this delivery?: No  Feeding    LATCH Score/Interventions                      Lactation Tools Discussed/Used     Consult Status Consult Status: Follow-up Date: 06/12/14 Follow-up type: In-patient    Truddie Crumble 06/12/2014, 10:47 AM

## 2014-06-13 MED ORDER — BENZOCAINE-MENTHOL 20-0.5 % EX AERO: 1.0000 "application " | INHALATION_SPRAY | CUTANEOUS | Status: DC | PRN

## 2014-06-13 MED ORDER — OXYCODONE-ACETAMINOPHEN 5-325 MG PO TABS
1.0000 | ORAL_TABLET | ORAL | Status: DC | PRN
Start: 2014-06-13 — End: 2014-07-19

## 2014-06-13 MED ORDER — FERROUS SULFATE 325 (65 FE) MG PO TABS: 325.0000 mg | ORAL_TABLET | Freq: Every day | ORAL | Status: DC

## 2014-06-13 MED ORDER — LANOLIN HYDROUS EX OINT: 1.0000 "application " | TOPICAL_OINTMENT | CUTANEOUS | Status: DC | PRN

## 2014-06-13 MED ORDER — IBUPROFEN 600 MG PO TABS: 600.0000 mg | ORAL_TABLET | Freq: Four times a day (QID) | ORAL | Status: DC

## 2014-06-13 NOTE — Progress Notes (Signed)
Clinical Social Work Department PSYCHOSOCIAL ASSESSMENT - MATERNAL/CHILD 06/13/2014  Patient:  Kathleen Lloyd, Kathleen Lloyd  Account Number:  [id number]  Admit Date:  06/11/2014  Ardine Eng Name:   [name]   Clinical Social Worker:  Lucita Ferrara, CLINICAL SOCIAL WORKER   Date/Time:  06/13/2014 09:45 AM  Date Referred:  06/12/2014   Referral source  Central Nursery     Referred reason  Depression/Anxiety   Other referral source:    I:  FAMILY / HOME ENVIRONMENT Child's legal guardian:  PARENT  Guardian - Name Guardian - Age Guardian - Address  Kathleen Lloyd [place], [place] [address]  Kathleen Lloyd  same as above   Other household support members/support persons Other support:   MOB identified the FOB's brother and his wife as her primary supports.  She shared that they live in town and are actively involved in their lives.    II  PSYCHOSOCIAL DATA Information Source:  Family Interview  Financial and [employer] Employment:   MOB is currently not working.  FOB works for [employer] and shared that he is supported by his employer as he becomes a parent.   Financial resources:  Medicaid If Medicaid - County:  [place] / Grade:  N/A Music therapist / Child Services Coordination / Early Interventions:   N/A  Cultural issues impacting care:   None reported    III  STRENGTHS Strengths  Adequate Resources  Home prepared for Child (including basic supplies)  Supportive family/friends   Strength comment:    IV  RISK FACTORS AND CURRENT PROBLEMS Current Problem:  YES   Risk Factor & Current Problem Patient Issue Family Issue Risk Factor / Current Problem Comment  Mental Illness Y N MOB presented with history of depression and anxiety.  MOB denied current symptoms, and stated that her symptoms are controlled when she talks to her support system.  MOB presented with feeling uncomfortable as CSW attempted to  clarify symptoms and she verbalized that she does not like talking about her prior symptoms.    V  SOCIAL WORK ASSESSMENT CSW met with MOB in her room in order to complete the assessment. Consult was ordered due to MOB presenting with a history of anxiety and depression.  FOB and the newborn's aunt were present for the assessment and participated with the consent of the MOB.  MOB displayed appropriate range in affect and presented in a pleasant mood. MOB was minimally receptive to discussing her history of anxiety and depression, and she acknowledged that she is guarded and does not like talking about her issues with outsiders.  She did not present with any acute symptoms at time of assessment.   MOB and FOB expressed excitement as they prepare to transition home.  FOB was observed to be packing up their items since they were anticipating a discharge very soon.  They expressed that they are supported by family and friends, and denied barriers to utilizing support if needed. MOB shared that she is looking forward to staying at home with the newborn, and they denied financial stressors since the FOB is getting paid well by his employer. FOB mentioned recent death of his father to cancer (within the last month).  He expressed that he is "doing well" with the loss.  CSW inquired about MOB's ability to cope with the loss, and she denied any difficulties.  MOB confirmed that it has been more stressful watching the FOB with  the loss versus the loss she feels herself since she did not have a close relationship with the FOB.  CSW attempted to normalize the feelings of loss and the stress that it causes during the last month of pregnancy as it juxtaposes the excitement associated with preparing to give birth; however, MOB's body language continued to express minimal desire to talk about her feelings.    CSW inquired about MOB's history of depression and anxiety.  She stated that she attempted therapy one time prior to  her pregnancy but did not return since she did not like the therapist.  She denied desire to return and stated that she is able to cope with her symptoms with the assistance of her support system. The FOB and the baby's aunt confirmed that they openly talk about her feelings.  MOB was receptive to education on postpartum depression and is agreeable to reaching out to medical providers if symptoms occur.  CSW discussed with MOB the perceptions that she feels discomfort as CSW inquired about feelings, depression, and anxiety.  CSW normalized the tendency to push away from these conversations because no one enjoys feeling the discomfort.  MOB confirmed that she does not want to talk about it with a stranger, and she stated that she feels well supported.   No barriers to discharge.    VI SOCIAL WORK PLAN Social Work Plan  No Further Intervention Required / No Barriers to Discharge  Patient/Family Education   Type of pt/family education:   Postpartump depression   If child protective services report - county:   If child protective services report - date:   Information/referral to community resources comment:   MOB declined need for referrals.   Other social work plan:   CSW to provide ongoing emotional support PRN.     

## 2014-06-13 NOTE — Lactation Note (Signed)
This note was copied from the chart of [place]. Lactation Consultation Note: Follow up visit with mom before DC. Mom reports that she is still having trouble getting the baby to latch on. Was assisted with NS last evening and mom reports that he did pretty well with assist. Offered assist this morning but he had formula about 45 mins ago and is asleep in bassinet at present. Encouraged to page for assist when he wakes up for next feeding. Suggested pumping and bottle feeding but she did not seem interested in that. Does not have pump at home. Has not pumped with DEBP here. No questions at present. To call prn.  Patient Name: Kathleen Lloyd [id number]'K Date: 06/13/2014 Reason for consult: Follow-up assessment   Maternal Data    Feeding   LATCH Score/Interventions                      Lactation Tools Discussed/Used     Consult Status Consult Status: Complete    Truddie Crumble 06/13/2014, 8:49 AM

## 2014-06-13 NOTE — Discharge Summary (Signed)
Vaginal Delivery Discharge Summary  ALL information will be verified prior to discharge  Kathleen Lloyd  DOB:    [birth year] MRN:    [id number] CSN:    [id number]  Date of admission:                  06/11/14  Date of discharge:                   06/13/14  Procedures this admission: SVD  Date of Delivery: 06/11/14  Newborn Data:  Live born  Information for the patient's newborn:  Annelie, Boak [[id number]]  female   Live born female  Birth Weight: 7 lb 1.8 oz (3226 g) APGAR: 5, 8  Home with mother. Name: Herschel [place] Circumcision Plan: out patient  History of Present Illness: Kathleen Lloyd is a 18 y.o. female, G1P1001, who presents at [redacted]w[redacted]d weeks gestation. The patient has been followed at the [facility] and Gynecology division of [facility]. She was admitted onset of labor. Her pregnancy has been complicated by:  Patient Active Problem List   Diagnosis Date Noted  . Vaginal delivery 06/11/2014  . Positive GBS test 06/11/2014  . Cellulitis of axilla, right 03/15/2014    Hospital course: The patient was admitted for labor.   Her labor was not complicated. She proceeded to have a vaginal delivery of a healthy infant. Her delivery was not complicated. Her postpartum course was not complicated. She was discharged to home on postpartum day 2 doing well.  Feeding: breast  Contraception: IUD Pt understands the risks are but not limited to irregular bleeding, formation of DVT, fluid fluctuations, elevation in blood pressure, stroke, breast tenderness and liver damage.  She states she will report any serious side effects.  She has been given verbal and written instructions and voiced a clear understanding.    Discharge hemoglobin: Hemoglobin  Date Value Ref Range Status  06/12/2014 10.0* 12.0 - 15.0 g/dL Final     DELTA CHECK NOTED     REPEATED TO VERIFY     HCT  Date Value Ref Range Status  06/12/2014 30.4* 36.0 - 46.0 % Final    PreNatal  Labs ABO, Rh: O/Positive/-- (01/20 0000)   Antibody: Negative (01/20 0000) Rubella:    immune RPR: NON REAC (09/20 0415)  HBsAg: Negative (01/20 0000)  HIV: Non-reactive (01/20 0000)  GBS: Positive (08/17 0000)  Discharge Physical Exam:  General: alert and cooperative Lochia: appropriate Uterine Fundus: firm Incision: healing well DVT Evaluation: No evidence of DVT seen on physical exam.  Intrapartum Procedures: spontaneous vaginal delivery Postpartum Procedures: none Complications-Operative and Postpartum: Bilateral periurethral degree perineal laceration  Discharge Diagnoses: Post-date pregnancy   Activity:           unrestricted and pelvic rest Diet:                routine Medications: PNV, Ibuprofen, Iron and Percocet Condition:      stable     Postpartum Teaching: Nutrition, exercise, return to work or school, family visits, sexual activity, home rest, vaginal bleeding, pelvic rest, family planning, s/s of PPD, breast care peri-care and incision care   Discharge to: home     Yama Nielson, CNM, MSN 06/13/2014. 7:24 AM   Postpartum Care After Vaginal Delivery  After you deliver your newborn (postpartum period), the usual stay in the hospital is 24 72 hours. If there were problems with your labor or delivery, or if you have other medical problems,  you might be in the hospital longer.  While you are in the hospital, you will receive help and instructions on how to care for yourself and your newborn during the postpartum period.  While you are in the hospital:  Be sure to tell your nurses if you have pain or discomfort, as well as where you feel the pain and what makes the pain worse.  If you had an incision made near your vagina (episiotomy) or if you had some tearing during delivery, the nurses may put ice packs on your episiotomy or tear. The ice packs may help to reduce the pain and swelling.  If you are breastfeeding, you may feel uncomfortable contractions  of your uterus for a couple of weeks. This is normal. The contractions help your uterus get back to normal size.  It is normal to have some bleeding after delivery.  For the first 1 3 days after delivery, the flow is red and the amount may be similar to a period.  It is common for the flow to start and stop.  In the first few days, you may pass some small clots. Let your nurses know if you begin to pass large clots or your flow increases.  Do not  flush blood clots down the toilet before having the nurse look at them.  During the next 3 10 days after delivery, your flow should become more watery and pink or brown-tinged in color.  Ten to fourteen days after delivery, your flow should be a small amount of yellowish-white discharge.  The amount of your flow will decrease over the first few weeks after delivery. Your flow may stop in 6 8 weeks. Most women have had their flow stop by 12 weeks after delivery.  You should change your sanitary pads frequently.  Wash your hands thoroughly with soap and water for at least 20 seconds after changing pads, using the toilet, or before holding or feeding your newborn.  You should feel like you need to empty your bladder within the first 6 8 hours after delivery.  In case you become weak, lightheaded, or faint, call your nurse before you get out of bed for the first time and before you take a shower for the first time.  Within the first few days after delivery, your breasts may begin to feel tender and full. This is called engorgement. Breast tenderness usually goes away within 48 72 hours after engorgement occurs. You may also notice milk leaking from your breasts. If you are not breastfeeding, do not stimulate your breasts. Breast stimulation can make your breasts produce more milk.  Spending as much time as possible with your newborn is very important. During this time, you and your newborn can feel close and get to know each other. Having your  newborn stay in your room (rooming in) will help to strengthen the bond with your newborn. It will give you time to get to know your newborn and become comfortable caring for your newborn.  Your hormones change after delivery. Sometimes the hormone changes can temporarily cause you to feel sad or tearful. These feelings should not last more than a few days. If these feelings last longer than that, you should talk to your caregiver.  If desired, talk to your caregiver about methods of family planning or contraception.  Talk to your caregiver about immunizations. Your caregiver may want you to have the following immunizations before leaving the hospital:  Tetanus, diphtheria, and pertussis (Tdap) or tetanus and diphtheria (  Td) immunization. It is very important that you and your family (including grandparents) or others caring for your newborn are up-to-date with the Tdap or Td immunizations. The Tdap or Td immunization can help protect your newborn from getting ill.  Rubella immunization.  Varicella (chickenpox) immunization.  Influenza immunization. You should receive this annual immunization if you did not receive the immunization during your pregnancy. Document Released: 07/06/2007 Document Revised: 06/02/2012 Document Reviewed: 05/05/2012 [facility] Patient Information 2014 [place].   Postpartum Depression and Baby Blues  The postpartum period begins right after the birth of a baby. During this time, there is often a great amount of joy and excitement. It is also a time of considerable changes in the life of the parent(s). Regardless of how many times a mother gives birth, each child brings new challenges and dynamics to the family. It is not unusual to have feelings of excitement accompanied by confusing shifts in moods, emotions, and thoughts. All mothers are at risk of developing postpartum depression or the "baby blues." These mood changes can occur right after giving birth, or  they may occur many months after giving birth. The baby blues or postpartum depression can be mild or severe. Additionally, postpartum depression can resolve rather quickly, or it can be a long-term condition. CAUSES Elevated hormones and their rapid decline are thought to be a main cause of postpartum depression and the baby blues. There are a number of hormones that radically change during and after pregnancy. Estrogen and progesterone usually decrease immediately after delivering your baby. The level of thyroid hormone and various cortisol steroids also rapidly drop. Other factors that play a major role in these changes include major life events and genetics.  RISK FACTORS If you have any of the following risks for the baby blues or postpartum depression, know what symptoms to watch out for during the postpartum period. Risk factors that may increase the likelihood of getting the baby blues or postpartum depression include:  Havinga personal or family history of depression.  Having depression while being pregnant.  Having premenstrual or oral contraceptive-associated mood issues.  Having exceptional life stress.  Having marital conflict.  Lacking a social support network.  Having a baby with special needs.  Having health problems such as diabetes. SYMPTOMS Baby blues symptoms include:  Brief fluctuations in mood, such as going from extreme happiness to sadness.  Decreased concentration.  Difficulty sleeping.  Crying spells, tearfulness.  Irritability.  Anxiety. Postpartum depression symptoms typically begin within the first month after giving birth. These symptoms include:  Difficulty sleeping or excessive sleepiness.  Marked weight loss.  Agitation.  Feelings of worthlessness.  Lack of interest in activity or food. Postpartum psychosis is a very concerning condition and can be dangerous. Fortunately, it is rare. Displaying any of the following symptoms is cause for  immediate medical attention. Postpartum psychosis symptoms include:  Hallucinations and delusions.  Bizarre or disorganized behavior.  Confusion or disorientation. DIAGNOSIS  A diagnosis is made by an evaluation of your symptoms. There are no medical or lab tests that lead to a diagnosis, but there are various questionnaires that a caregiver may use to identify those with the baby blues, postpartum depression, or psychosis. Often times, a screening tool called the [place] Postnatal Depression Scale is used to diagnose depression in the postpartum period.  TREATMENT The baby blues usually goes away on its own in 1 to 2 weeks. Social support is often all that is needed. You should be encouraged to get  adequate sleep and rest. Occasionally, you may be given medicines to help you sleep.  Postpartum depression requires treatment as it can last several months or longer if it is not treated. Treatment may include individual or group therapy, medicine, or both to address any social, physiological, and psychological factors that may play a role in the depression. Regular exercise, a healthy diet, rest, and social support may also be strongly recommended.  Postpartum psychosis is more serious and needs treatment right away. Hospitalization is often needed. HOME CARE INSTRUCTIONS  Get as much rest as you can. Nap when the baby sleeps.  Exercise regularly. Some women find yoga and walking to be beneficial.  Eat a balanced and nourishing diet.  Do little things that you enjoy. Have a cup of tea, take a bubble bath, read your favorite magazine, or listen to your favorite music.  Avoid alcohol.  Ask for help with household chores, cooking, grocery shopping, or running errands as needed. Do not try to do everything.  Talk to people close to you about how you are feeling. Get support from your partner, family members, friends, or other new moms.  Try to stay positive in how you think. Think about the  things you are grateful for.  Do not spend a lot of time alone.  Only take medicine as directed by your caregiver.  Keep all your postpartum appointments.  Let your caregiver know if you have any concerns. SEEK MEDICAL CARE IF: You are having a reaction or problems with your medicine. SEEK IMMEDIATE MEDICAL CARE IF:  You have suicidal feelings.  You feel you may harm the baby or someone else. Document Released: 06/12/2004 Document Revised: 12/01/2011 Document Reviewed: 07/15/2011 [facility] Patient Information 2014 [place], [place].

## 2014-06-13 NOTE — Discharge Instructions (Signed)
Breastfeeding Deciding to breastfeed is one of the best choices you can make for you and your baby. A change in hormones during pregnancy causes your breast tissue to grow and increases the number and size of your milk ducts. These hormones also allow proteins, sugars, and fats from your blood supply to make breast milk in your milk-producing glands. Hormones prevent breast milk from being released before your baby is born as well as prompt milk flow after birth. Once breastfeeding has begun, thoughts of your baby, as well as his or her sucking or crying, can stimulate the release of milk from your milk-producing glands.  BENEFITS OF BREASTFEEDING For Your Baby  Your first milk (colostrum) helps your baby's digestive system function better.   There are antibodies in your milk that help your baby fight off infections.   Your baby has a lower incidence of asthma, allergies, and sudden infant death syndrome.   The nutrients in breast milk are better for your baby than infant formulas and are designed uniquely for your baby's needs.   Breast milk improves your baby's brain development.   Your baby is less likely to develop other conditions, such as childhood obesity, asthma, or type 2 diabetes mellitus.  For You   Breastfeeding helps to create a very special bond between you and your baby.   Breastfeeding is convenient. Breast milk is always available at the correct temperature and costs nothing.   Breastfeeding helps to burn calories and helps you lose the weight gained during pregnancy.   Breastfeeding makes your uterus contract to its prepregnancy size faster and slows bleeding (lochia) after you give birth.   Breastfeeding helps to lower your risk of developing type 2 diabetes mellitus, osteoporosis, and breast or ovarian cancer later in life. SIGNS THAT YOUR BABY IS HUNGRY Early Signs of Hunger  Increased alertness or activity.  Stretching.  Movement of the head from  side to side.  Movement of the head and opening of the mouth when the corner of the mouth or cheek is stroked (rooting).  Increased sucking sounds, smacking lips, cooing, sighing, or squeaking.  Hand-to-mouth movements.  Increased sucking of fingers or hands. Late Signs of Hunger  Fussing.  Intermittent crying. Extreme Signs of Hunger Signs of extreme hunger will require calming and consoling before your baby will be able to breastfeed successfully. Do not wait for the following signs of extreme hunger to occur before you initiate breastfeeding:   Restlessness.  A loud, strong cry.   Screaming. BREASTFEEDING BASICS Breastfeeding Initiation  Find a comfortable place to sit or lie down, with your neck and back well supported.  Place a pillow or rolled up blanket under your baby to bring him or her to the level of your breast (if you are seated). Nursing pillows are specially designed to help support your arms and your baby while you breastfeed.  Make sure that your baby's abdomen is facing your abdomen.   Gently massage your breast. With your fingertips, massage from your chest wall toward your nipple in a circular motion. This encourages milk flow. You may need to continue this action during the feeding if your milk flows slowly.  Support your breast with 4 fingers underneath and your thumb above your nipple. Make sure your fingers are well away from your nipple and your baby's mouth.   Stroke your baby's lips gently with your finger or nipple.   When your baby's mouth is open wide enough, quickly bring your baby to your   breast, placing your entire nipple and as much of the colored area around your nipple (areola) as possible into your baby's mouth.   More areola should be visible above your baby's upper lip than below the lower lip.   Your baby's tongue should be between his or her lower gum and your breast.   Ensure that your baby's mouth is correctly positioned  around your nipple (latched). Your baby's lips should create a seal on your breast and be turned out (everted).  It is common for your baby to suck about 2-3 minutes in order to start the flow of breast milk. Latching Teaching your baby how to latch on to your breast properly is very important. An improper latch can cause nipple pain and decreased milk supply for you and poor weight gain in your baby. Also, if your baby is not latched onto your nipple properly, he or she may swallow some air during feeding. This can make your baby fussy. Burping your baby when you switch breasts during the feeding can help to get rid of the air. However, teaching your baby to latch on properly is still the best way to prevent fussiness from swallowing air while breastfeeding. Signs that your baby has successfully latched on to your nipple:    Silent tugging or silent sucking, without causing you pain.   Swallowing heard between every 3-4 sucks.    Muscle movement above and in front of his or her ears while sucking.  Signs that your baby has not successfully latched on to nipple:   Sucking sounds or smacking sounds from your baby while breastfeeding.  Nipple pain. If you think your baby has not latched on correctly, slip your finger into the corner of your baby's mouth to break the suction and place it between your baby's gums. Attempt breastfeeding initiation again. Signs of Successful Breastfeeding Signs from your baby:   A gradual decrease in the number of sucks or complete cessation of sucking.   Falling asleep.   Relaxation of his or her body.   Retention of a small amount of milk in his or her mouth.   Letting go of your breast by himself or herself. Signs from you:  Breasts that have increased in firmness, weight, and size 1-3 hours after feeding.   Breasts that are softer immediately after breastfeeding.  Increased milk volume, as well as a change in milk consistency and color by  the fifth day of breastfeeding.   Nipples that are not sore, cracked, or bleeding. Signs That Your Baby is Getting Enough Milk  Wetting at least 3 diapers in a 24-hour period. The urine should be clear and pale yellow by age 5 days.  At least 3 stools in a 24-hour period by age 5 days. The stool should be soft and yellow.  At least 3 stools in a 24-hour period by age 7 days. The stool should be seedy and yellow.  No loss of weight greater than 10% of birth weight during the first 3 days of age.  Average weight gain of 4-7 ounces (113-198 g) per week after age 4 days.  Consistent daily weight gain by age 5 days, without weight loss after the age of 2 weeks. After a feeding, your baby may spit up a small amount. This is common. BREASTFEEDING FREQUENCY AND DURATION Frequent feeding will help you make more milk and can prevent sore nipples and breast engorgement. Breastfeed when you feel the need to reduce the fullness of your breasts   or when your baby shows signs of hunger. This is called "breastfeeding on demand." Avoid introducing a pacifier to your baby while you are working to establish breastfeeding (the first 4-6 weeks after your baby is born). After this time you may choose to use a pacifier. Research has shown that pacifier use during the first year of a baby's life decreases the risk of sudden infant death syndrome (SIDS). Allow your baby to feed on each breast as long as he or she wants. Breastfeed until your baby is finished feeding. When your baby unlatches or falls asleep while feeding from the first breast, offer the second breast. Because newborns are often sleepy in the first few weeks of life, you may need to awaken your baby to get him or her to feed. Breastfeeding times will vary from baby to baby. However, the following rules can serve as a guide to help you ensure that your baby is properly fed:  Newborns (babies 4 weeks of age or younger) may breastfeed every 1-3  hours.  Newborns should not go longer than 3 hours during the day or 5 hours during the night without breastfeeding.  You should breastfeed your baby a minimum of 8 times in a 24-hour period until you begin to introduce solid foods to your baby at around 6 months of age. BREAST MILK PUMPING Pumping and storing breast milk allows you to ensure that your baby is exclusively fed your breast milk, even at times when you are unable to breastfeed. This is especially important if you are going back to work while you are still breastfeeding or when you are not able to be present during feedings. Your lactation consultant can give you guidelines on how long it is safe to store breast milk.  A breast pump is a machine that allows you to pump milk from your breast into a sterile bottle. The pumped breast milk can then be stored in a refrigerator or freezer. Some breast pumps are operated by hand, while others use electricity. Ask your lactation consultant which type will work best for you. Breast pumps can be purchased, but some hospitals and breastfeeding support groups lease breast pumps on a monthly basis. A lactation consultant can teach you how to hand express breast milk, if you prefer not to use a pump.  CARING FOR YOUR BREASTS WHILE YOU BREASTFEED Nipples can become dry, cracked, and sore while breastfeeding. The following recommendations can help keep your breasts moisturized and healthy:  Avoid using soap on your nipples.   Wear a supportive bra. Although not required, special nursing bras and tank tops are designed to allow access to your breasts for breastfeeding without taking off your entire bra or top. Avoid wearing underwire-style bras or extremely tight bras.  Air dry your nipples for 3-4minutes after each feeding.   Use only cotton bra pads to absorb leaked breast milk. Leaking of breast milk between feedings is normal.   Use lanolin on your nipples after breastfeeding. Lanolin helps to  maintain your skin's normal moisture barrier. If you use pure lanolin, you do not need to wash it off before feeding your baby again. Pure lanolin is not toxic to your baby. You may also hand express a few drops of breast milk and gently massage that milk into your nipples and allow the milk to air dry. In the first few weeks after giving birth, some women experience extremely full breasts (engorgement). Engorgement can make your breasts feel heavy, warm, and tender to the   touch. Engorgement peaks within 3-5 days after you give birth. The following recommendations can help ease engorgement:  Completely empty your breasts while breastfeeding or pumping. You may want to start by applying warm, moist heat (in the shower or with warm water-soaked hand towels) just before feeding or pumping. This increases circulation and helps the milk flow. If your baby does not completely empty your breasts while breastfeeding, pump any extra milk after he or she is finished.  Wear a snug bra (nursing or regular) or tank top for 1-2 days to signal your body to slightly decrease milk production.  Apply ice packs to your breasts, unless this is too uncomfortable for you.  Make sure that your baby is latched on and positioned properly while breastfeeding. If engorgement persists after 48 hours of following these recommendations, contact your health care provider or a lactation consultant. OVERALL HEALTH CARE RECOMMENDATIONS WHILE BREASTFEEDING  Eat healthy foods. Alternate between meals and snacks, eating 3 of each per day. Because what you eat affects your breast milk, some of the foods may make your baby more irritable than usual. Avoid eating these foods if you are sure that they are negatively affecting your baby.  Drink milk, fruit juice, and water to satisfy your thirst (about 10 glasses a day).   Rest often, relax, and continue to take your prenatal vitamins to prevent fatigue, stress, and anemia.  Continue  breast self-awareness checks.  Avoid chewing and smoking tobacco.  Avoid alcohol and drug use. Some medicines that may be harmful to your baby can pass through breast milk. It is important to ask your health care provider before taking any medicine, including all over-the-counter and prescription medicine as well as vitamin and herbal supplements. It is possible to become pregnant while breastfeeding. If birth control is desired, ask your health care provider about options that will be safe for your baby. SEEK MEDICAL CARE IF:   You feel like you want to stop breastfeeding or have become frustrated with breastfeeding.  You have painful breasts or nipples.  Your nipples are cracked or bleeding.  Your breasts are red, tender, or warm.  You have a swollen area on either breast.  You have a fever or chills.  You have nausea or vomiting.  You have drainage other than breast milk from your nipples.  Your breasts do not become full before feedings by the fifth day after you give birth.  You feel sad and depressed.  Your baby is too sleepy to eat well.  Your baby is having trouble sleeping.   Your baby is wetting less than 3 diapers in a 24-hour period.  Your baby has less than 3 stools in a 24-hour period.  Your baby's skin or the white part of his or her eyes becomes yellow.   Your baby is not gaining weight by 5 days of age. SEEK IMMEDIATE MEDICAL CARE IF:   Your baby is overly tired (lethargic) and does not want to wake up and feed.  Your baby develops an unexplained fever. Document Released: 09/08/2005 Document Revised: 09/13/2013 Document Reviewed: 03/02/2013 [employer] Patient Information 2015 [employer]. This information is not intended to replace advice given to you by your health care provider. Make sure you discuss any questions you have with your health care provider.  

## 2014-06-22 ENCOUNTER — Encounter (HOSPITAL_COMMUNITY): Payer: Self-pay | Admitting: Emergency Medicine

## 2014-06-22 ENCOUNTER — Emergency Department (INDEPENDENT_AMBULATORY_CARE_PROVIDER_SITE_OTHER)
Admission: EM | Admit: 2014-06-22 | Discharge: 2014-06-22 | Disposition: A | Discharge status: Home / Self Care | Payer: Medicaid Other | Source: Home / Self Care | Attending: Emergency Medicine | Admitting: Emergency Medicine

## 2014-06-22 DIAGNOSIS — J029 Acute pharyngitis, unspecified: Secondary | ICD-10-CM

## 2014-06-22 DIAGNOSIS — L739 Follicular disorder, unspecified: Secondary | ICD-10-CM

## 2014-06-22 DIAGNOSIS — L259 Unspecified contact dermatitis, unspecified cause: Secondary | ICD-10-CM

## 2014-06-22 LAB — POCT URINALYSIS DIP (DEVICE)
Glucose, UA: 100 mg/dL — AB
KETONES UR: 15 mg/dL — AB
Nitrite: NEGATIVE
PH: 6.5 (ref 5.0–8.0)
Protein, ur: NEGATIVE mg/dL
SPECIFIC GRAVITY, URINE: 1.015 (ref 1.005–1.030)

## 2014-06-22 LAB — POCT RAPID STREP A: Streptococcus, Group A Screen (Direct): NEGATIVE

## 2014-06-22 MED ORDER — ACETAMINOPHEN 325 MG PO TABS
ORAL_TABLET | ORAL | Status: AC
  Filled 2014-06-22: qty 2

## 2014-06-22 MED ORDER — HYDROCORTISONE 1 % EX CREA: TOPICAL_CREAM | CUTANEOUS | Status: DC

## 2014-06-22 MED ORDER — CLINDAMYCIN HCL 300 MG PO CAPS: 300.0000 mg | ORAL_CAPSULE | Freq: Four times a day (QID) | ORAL | Status: DC

## 2014-06-22 MED ORDER — ACETAMINOPHEN 325 MG PO TABS
650.0000 mg | ORAL_TABLET | Freq: Once | ORAL | Status: AC
  Administered 2014-06-22: 650 mg via ORAL

## 2014-06-22 NOTE — ED Notes (Signed)
Pt is eleven days post-partum and is currently breast feeding.  Mw,cma

## 2014-06-22 NOTE — ED Notes (Signed)
C/o  Fever.  Headache.  Chills.  Fatigue.  And sore throat.      Also noticed rash on both breast.   Onset yesterday.    Last dose of tylenol/ibuprofen was yesterday evening.

## 2014-06-22 NOTE — Discharge Instructions (Signed)
Folliculitis  Folliculitis is redness, soreness, and swelling (inflammation) of the hair follicles. This condition can occur anywhere on the body. People with weakened immune systems, diabetes, or obesity have a greater risk of getting folliculitis. CAUSES  Bacterial infection. This is the most common cause.  Fungal infection.  Viral infection.  Contact with certain chemicals, especially oils and tars. Long-term folliculitis can result from bacteria that live in the nostrils. The bacteria may trigger multiple outbreaks of folliculitis over time. SYMPTOMS Folliculitis most commonly occurs on the scalp, thighs, legs, back, buttocks, and areas where hair is shaved frequently. An early sign of folliculitis is a small, white or yellow, pus-filled, itchy lesion (pustule). These lesions appear on a red, inflamed follicle. They are usually less than 0.2 inches (5 mm) wide. When there is an infection of the follicle that goes deeper, it becomes a boil or furuncle. A group of closely packed boils creates a larger lesion (carbuncle). Carbuncles tend to occur in hairy, sweaty areas of the body. DIAGNOSIS  Your caregiver can usually tell what is wrong by doing a physical exam. A sample may be taken from one of the lesions and tested in a lab. This can help determine what is causing your folliculitis. TREATMENT  Treatment may include:  Applying warm compresses to the affected areas.  Taking antibiotic medicines orally or applying them to the skin.  Draining the lesions if they contain a large amount of pus or fluid.  Laser hair removal for cases of long-lasting folliculitis. This helps to prevent regrowth of the hair. HOME CARE INSTRUCTIONS  Apply warm compresses to the affected areas as directed by your caregiver.  If antibiotics are prescribed, take them as directed. Finish them even if you start to feel better.  You may take over-the-counter medicines to relieve itching.  Do not shave  irritated skin.  Follow up with your caregiver as directed. SEEK IMMEDIATE MEDICAL CARE IF:   You have increasing redness, swelling, or pain in the affected area.  You have a fever. MAKE SURE YOU:  Understand these instructions.  Will watch your condition.  Will get help right away if you are not doing well or get worse. Document Released: 11/17/2001 Document Revised: 03/09/2012 Document Reviewed: 12/09/2011 [facility] Patient Information 2015 [place], [place]. This information is not intended to replace advice given to you by your health care provider. Make sure you discuss any questions you have with your health care provider.  Pharyngitis Pharyngitis is redness, pain, and swelling (inflammation) of your pharynx.  CAUSES  Pharyngitis is usually caused by infection. Most of the time, these infections are from viruses (viral) and are part of a cold. However, sometimes pharyngitis is caused by bacteria (bacterial). Pharyngitis can also be caused by allergies. Viral pharyngitis may be spread from person to person by coughing, sneezing, and personal items or utensils (cups, forks, spoons, toothbrushes). Bacterial pharyngitis may be spread from person to person by more intimate contact, such as kissing.  SIGNS AND SYMPTOMS  Symptoms of pharyngitis include:   Sore throat.   Tiredness (fatigue).   Low-grade fever.   Headache.  Joint pain and muscle aches.  Skin rashes.  Swollen lymph nodes.  Plaque-like film on throat or tonsils (often seen with bacterial pharyngitis). DIAGNOSIS  Your health care provider will ask you questions about your illness and your symptoms. Your medical history, along with a physical exam, is often all that is needed to diagnose pharyngitis. Sometimes, a rapid strep test is done. Other lab tests  may also be done, depending on the suspected cause.  TREATMENT  Viral pharyngitis will usually get better in 3-4 days without the use of medicine. Bacterial  pharyngitis is treated with medicines that kill germs (antibiotics).  HOME CARE INSTRUCTIONS   Drink enough water and fluids to keep your urine clear or pale yellow.   Only take over-the-counter or prescription medicines as directed by your health care provider:   If you are prescribed antibiotics, make sure you finish them even if you start to feel better.   Do not take aspirin.   Get lots of rest.   Gargle with 8 oz of salt water ( tsp of salt per 1 qt of water) as often as every 1-2 hours to soothe your throat.   Throat lozenges (if you are not at risk for choking) or sprays may be used to soothe your throat. SEEK MEDICAL CARE IF:   You have large, tender lumps in your neck.  You have a rash.  You cough up green, yellow-brown, or bloody spit. SEEK IMMEDIATE MEDICAL CARE IF:   Your neck becomes stiff.  You drool or are unable to swallow liquids.  You vomit or are unable to keep medicines or liquids down.  You have severe pain that does not go away with the use of recommended medicines.  You have trouble breathing (not caused by a stuffy nose). MAKE SURE YOU:   Understand these instructions.  Will watch your condition.  Will get help right away if you are not doing well or get worse. Document Released: 09/08/2005 Document Revised: 06/29/2013 Document Reviewed: 05/16/2013 [facility] Patient Information 2015 [place], [place]. This information is not intended to replace advice given to you by your health care provider. Make sure you discuss any questions you have with your health care provider.

## 2014-06-22 NOTE — ED Provider Notes (Signed)
Chief Complaint   Fever   History of Present Illness   Kathleen Lloyd is a 18 year old female who is 11 days postpartum with a normal pregnancy, labor, and delivery. Since last night she's had a temperature of up to 101.3, chills, headaches, and a scratchy throat. She also notes generalized weakness, malaise, fatigue, and sweats. She has had slight nasal congestion but no cough. She has a pruritic rash on her breasts, around her nipples. This is mildly irritated when her son nurses. She denies any breast pain or purulent drainage. She also has a pustular rash on her abdomen and 1 lesion on her left forearm. She denies any abdominal pain, nausea, or vomiting. She has the usual postpartum lochia. She denies any heavy, purulent, or foul-smelling drainage. She denies any urinary symptoms.   Review of Systems   Other than as noted above, the patient denies any of the following symptoms. Systemic:  No fever, chills, sweats, myalgias, or headache. Eye:  No redness, pain or drainage. ENT:  No earache, nasal congestion, sneezing, rhinorrhea, sinus pressure, sinus pain, or post nasal drip. Lungs:  No cough, sputum production, wheezing, shortness of breath, or chest pain. GI:  No abdominal pain, nausea, vomiting, or diarrhea. Skin:  No rash.  [place]   Past medical history, family history, social history, meds, and allergies were reviewed. She takes a prenatal vitamin and has Percocet and ibuprofen at home for pain.  Physical Exam     Vital signs:  BP 118/56  Pulse 116  Temp(Src) 101.9 F (38.8 C) (Oral)  Resp 16  SpO2 99%  LMP 08/28/2013 General:  Alert, in no distress. Phonation was normal, no drooling, and patient was able to handle secretions well.  Eye:  No conjunctival injection or drainage. Lids were normal. ENT:  TMs and canals were normal, without erythema or inflammation.  Nasal mucosa was clear and uncongested, without drainage.  Mucous membranes were moist.  Exam of pharynx pharynx  is erythematous but not swollen and there is no exudate or ulcerations.  There were no oral ulcerations or lesions. There was no bulging of the tonsillar pillars, and the uvula was midline. Neck:  Supple, no adenopathy, tenderness or mass. Lungs:  No respiratory distress.  Lungs were clear to auscultation, without wheezes, rales or rhonchi.  Breath sounds were clear and equal bilaterally.  Heart:  Regular rhythm, without gallops, murmers or rubs. Skin:  Clear, warm, and dry, she has 2 kinds of rash, the first is an erythematous maculopapular rash on the nipples, realign, and around the areola in. This is nontender to palpation, and there is no breast tenderness to palpation. The second rash is a pustular rash of which she has 5 or 6 pustules on her abdomen is shown below and one small erythematous bumps on her left forearm.      Labs   Results for orders placed during the hospital encounter of 06/22/14  POCT RAPID STREP A ([facility] CARE ONLY)      Result Value Ref Range   Streptococcus, Group A Screen (Direct) NEGATIVE  NEGATIVE  POCT URINALYSIS DIP (DEVICE)      Result Value Ref Range   Glucose, UA 100 (*) NEGATIVE mg/dL   Bilirubin Urine SMALL (*) NEGATIVE   Ketones, ur 15 (*) NEGATIVE mg/dL   Specific Gravity, Urine 1.015  1.005 - 1.030   Hgb urine dipstick MODERATE (*) NEGATIVE   pH 6.5  5.0 - 8.0   Protein, ur NEGATIVE  NEGATIVE mg/dL  Urobilinogen, UA >=8.0  0.0 - 1.0 mg/dL   Nitrite NEGATIVE  NEGATIVE   Leukocytes, UA SMALL (*) NEGATIVE    Course in Urgent [place]   The patient was given the following meds: Medications  acetaminophen (TYLENOL) tablet 650 mg (650 mg Oral Given 06/22/14 1321)   Assessment   The primary encounter diagnosis was Viral pharyngitis. Diagnoses of Contact dermatitis and Folliculitis were also pertinent to this visit.  There is no evidence of a peritonsillar abscess, retropharyngeal abscess, or epiglottitis.  She may have a viral pharyngitis.  Alternatively, this could be strep of the negative rapid strep antigen. Cultures are pending. The rash on the breast could be scarlet fever related. We'll know for sure in 2 days. The breast rash could also be a contact dermatitis. The rash on the abdomen appears to be a folliculitis. Will cover with clindamycin. We will have her return again in 48 hours for recheck.  Plan     1.  Meds:  The following meds were prescribed:   Discharge Medication List as of 06/22/2014  2:28 PM    START taking these medications   Details  clindamycin (CLEOCIN) 300 MG capsule Take 1 capsule (300 mg total) by mouth 4 (four) times daily., Starting 06/22/2014, Until Discontinued, Normal    hydrocortisone cream 1 % Apply to affected area 3 times daily, Normal        2.  Patient Education/Counseling:  The patient was given appropriate handouts, self care instructions, and instructed in symptomatic relief, including hot saline gargles, throat lozenges, infectious precautions, and need to trade out toothbrush.    3.  Follow up:  The patient was told to follow up here if no better in 3 to 4 days, or sooner if becoming worse in any way, and given some red flag symptoms such as difficulty swallowing or breathing which would prompt immediate return.       Harden Mo, MD 06/22/14 [phone]

## 2014-06-24 LAB — CULTURE, GROUP A STREP

## 2014-07-08 ENCOUNTER — Encounter (HOSPITAL_COMMUNITY): Payer: Self-pay | Admitting: Emergency Medicine

## 2014-07-08 ENCOUNTER — Emergency Department (INDEPENDENT_AMBULATORY_CARE_PROVIDER_SITE_OTHER)
Admission: EM | Admit: 2014-07-08 | Discharge: 2014-07-08 | Disposition: A | Discharge status: Home / Self Care | Payer: Medicaid Other | Source: Home / Self Care | Attending: Family Medicine | Admitting: Family Medicine

## 2014-07-08 DIAGNOSIS — K529 Noninfective gastroenteritis and colitis, unspecified: Secondary | ICD-10-CM

## 2014-07-08 NOTE — Discharge Instructions (Signed)
Clear liquid , bland diet today as tolerated, advance on sun as improved, use imodium as needed, return or see your doctor if any problems.

## 2014-07-08 NOTE — ED Provider Notes (Signed)
CSN: [id number]     Arrival date & time 07/08/14  1103 History   First MD Initiated Contact with Patient 07/08/14 1136     Chief Complaint  Patient presents with  . Diarrhea   (Consider location/radiation/quality/duration/timing/severity/associated sxs/prior Treatment) Patient is a 18 y.o. female presenting with diarrhea. The history is provided by the patient.  Diarrhea Quality:  Watery Severity:  Moderate Duration:  1 week Progression:  Unchanged (started after eating at [employer], 1 person had sx for only 1 day.) Relieved by:  None tried Worsened by:  Nothing tried Ineffective treatments:  None tried Associated symptoms: no abdominal pain, no fever and no vomiting   Risk factors: suspect food intake     Past Medical History  Diagnosis Date  . UTI (lower urinary tract infection)   . Anxiety     never treated  . Medical history non-contributory   . DGUYQIHK(742.5)    Past Surgical History  Procedure Laterality Date  . No past surgeries     Family History  Problem Relation Age of Onset  . Cancer Father     melanoma   History  Substance Use Topics  . Smoking status: Never Smoker   . Smokeless tobacco: Never Used  . Alcohol Use: No   OB History   Grav Para Term Preterm Abortions TAB SAB Ect Mult Living   1 1 1       1      Review of Systems  Constitutional: Negative for fever.  Gastrointestinal: Positive for nausea and diarrhea. Negative for vomiting and abdominal pain.  Genitourinary: Negative for dysuria, urgency, frequency and pelvic pain.    Allergies  Review of patient's allergies indicates no known allergies.  Home Medications   Prior to Admission medications   Medication Sig Start Date End Date Taking? Authorizing Provider  benzocaine-Menthol (DERMOPLAST) 20-0.5 % AERO Apply 1 application topically as needed for irritation (perineal discomfort). 06/13/14   Venus Standard, CNM  clindamycin (CLEOCIN) 300 MG capsule Take 1 capsule (300 mg total)  by mouth 4 (four) times daily. 06/22/14   Harden Mo, MD  ferrous sulfate (FERROUSUL) 325 (65 FE) MG tablet Take 1 tablet (325 mg total) by mouth daily with breakfast. 06/13/14   Venus Standard, CNM  hydrocortisone cream 1 % Apply to affected area 3 times daily 06/22/14   Harden Mo, MD  ibuprofen (ADVIL,MOTRIN) 600 MG tablet Take 1 tablet (600 mg total) by mouth every 6 (six) hours. 06/13/14   Venus Standard, CNM  lanolin OINT Apply 1 application topically as needed (for breast care). 06/13/14   Venus Standard, CNM  oxyCODONE-acetaminophen (PERCOCET/ROXICET) 5-325 MG per tablet Take 1 tablet by mouth every 4 (four) hours as needed (for pain scale less than 7). 06/13/14   Venus Standard, CNM  Prenatal Vit-Fe Fumarate-FA (PRENATAL MULTIVITAMIN) TABS tablet Take 1 tablet by mouth daily at 12 noon.    Historical Provider, MD   BP 112/73  Pulse 106  Temp(Src) 98.6 F (37 C) (Oral)  Resp 16  Ht 5\' 1"  (1.549 m)  Wt 130 lb (58.968 kg)  BMI 24.58 kg/m2  SpO2 98% Physical Exam  Nursing note and vitals reviewed. Constitutional: She is oriented to person, place, and time. She appears well-developed and well-nourished.  HENT:  Mouth/Throat: Oropharynx is clear and moist.  Neck: Normal range of motion. Neck supple.  Pulmonary/Chest: Effort normal and breath sounds normal.  Abdominal: Soft. Bowel sounds are normal. There is no tenderness.  Lymphadenopathy:  She has no cervical adenopathy.  Neurological: She is alert and oriented to person, place, and time.  Skin: Skin is warm and dry.    ED Course  Procedures (including critical care time) Labs Review Labs Reviewed - No data to display  Imaging Review No results found.   MDM   1. Gastroenteritis, acute        Billy Fischer, MD 07/08/14 1208

## 2014-07-08 NOTE — ED Notes (Signed)
Pt  Has  Symptoms  Of  Diarrhea      With  Stomach   Cramps  No  Vomiting   Pt has  Had  Some  Nausea         Symptoms  Began 6  Days  Ago  -  Got  Better  Then  Symptoms  Came  Back

## 2014-07-12 ENCOUNTER — Ambulatory Visit (HOSPITAL_COMMUNITY): Payer: Medicaid Other

## 2014-07-13 ENCOUNTER — Ambulatory Visit (HOSPITAL_COMMUNITY)
Admission: RE | Admit: 2014-07-13 | Discharge: 2014-07-13 | Disposition: A | Discharge status: Home / Self Care | Payer: Medicaid Other | Source: Ambulatory Visit | Attending: Obstetrics and Gynecology | Admitting: Obstetrics and Gynecology

## 2014-07-13 NOTE — Consult Note (Signed)
Addendum to the [facility] consult 10/22 am with Myer Haff , LC,    Infant a very excoriated buttock , scrotum, and upper inner thigh area, per mom mentioned she had switch to Zebulon and the redness appeared in the last 24 hours, has been using A+D ointment. The diaper rash doesn't appear to be yeast related , LC suggested using  Monostat ( anti - fungal cream ) on it after each diaper change), and gave mom a package of Pampers , and recommended to stop using the Baby Luv diapers. Also mentioned if has not improved in a few days to call Dr. Donnamarie Rossetti office to be seem. Baby is being tx for Thrush .

## 2014-07-13 NOTE — Lactation Note (Addendum)
Lactation Consult  Mother's reason for visit:  Low milk supply , pain while latching  Visit Type: Feeding assessment  Appointment Notes:  Confirmed  Consult:  Initial Lactation Consultant:  Myer Haff  ________________________________________________________________________ Kathleen Lloyd Name: Kathleen Lloyd  Date of Birth: 06/11/2016  Pediatrician: Dr. Julian Reil  Gender: female  Gestational Age: [redacted]w[redacted]d (At Birth)  Birth Weight: 7 lb 1.8 oz (3226 g)  Weight at Discharge: Weight: 6 lb 15.4 oz (3158 g) Date of Discharge: 06/13/2014  Filed Weights   06/11/14 1420 06/12/14 0100 06/13/14 0033  Weight: 7 lb 1.8 oz (3226 g) 7 lb 1.9 oz (3230 g) 6 lb 15.4 oz (3158 g)  Last weight taken from location outside of [facility]: 8-7 oz  Location:Pediatrician's office  Weight today:    9-4.5 oz 9 4210 g )    ________________________________________________________________________  Mother's Name: Kathleen Lloyd Type of delivery:  Vaginal  Breastfeeding Experience: per mom had trouble while in the hospital , having to use a nipple shield in the hospital and presently for latching  Maternal Medical Conditions:  Anxiety , per mom hx yeast infections  Maternal Medications:  PNV   ________________________________________________________________________  Breastfeeding History (Post Discharge) - per mom using a nipple shield ( #20 ) and a hand pump . Per mom the baby just started tx for  thrush yesterday with the Nystatin.  Frequency of breastfeeding: every 1 -2 hours  Duration of feeding:  15 -20 mins   Pumping : per mom is using a hand pump from the hospital with the #24 flange ( comfortable, and a lot of work )                     Using the hand pump after feedings about every 2 hours with 2-3 oz EBM yield   Supplementing: EBM and formula when not available with a (Munckin Bottle )   Infant Intake and Output Assessment  Voids: 6-7  in 24 hrs.  Color:  Clear yellow Stools:   in 24 hrs.   Color:  ( mom did not say )   ________________________________________________________________________  Maternal Breast Assessment  Breast:  Filling Nipple:  Flat ( areolas compressible )  Pain level:  0 Pain interventions:  Expressed breast milk  _______________________________________________________________________ Feeding Assessment/Evaluation -   Initial feeding assessment:  Infant's oral assessment:  Variance - LC noted the tongue to have a white coating ( Per mom the baby is presently being tx for thrush , started tx yesterday )                                                              Noted a short labia frenulum ( not tight ),able to stretch upper lip well, and and unable to assess tongue mobility due to baby crying.                                                             Per mom - baby does stretch tongue over gum line.  Positioning:  Cross cradle Left breast  LATCH documentation: Using  a #20 Nipple shield to start and switch to #24 NS due to mom feeling pinching ),   Latch:  2 = Grasps breast easily, tongue down, lips flanged, rhythmical sucking.  Audible swallowing:  2 = Spontaneous and intermittent  Type of nipple:  1 = Flat ( areola compressible )   Comfort (Breast/Nipple):  1 = Filling, red/small blisters or bruises, mild/mod discomfort No sore nipples noted , just fullness)   Hold (Positioning):  1 = Assistance needed to correctly position infant at breast and maintain latch ( worked on breast compressions and depth at the breast )   LATCH score:  7 - once the baby latched ,LC noted Kathleen Lloyd to stay in a consistent pattern for several suck/swallows and narrow sucking pattern, and not keep adequate depth.  Kathleen Lloyd  Reviewed basic latching techniques to kppe the depth and recommended with latching using the cross cradle hold and going to the cradle once the baby was in a consistent swallowing pattern.   Attached assessment:  Shallow at 1st and depth obtained   Lips  flanged:  No. ( LC showed mom how to flip upper lip )   Lips untucked:  Yes.    Suck assessment:  Nutritive  Tools:  Nipple shield #20 and switched to #24 Nipple shield due to pinching  Instructed on use and cleaning of tool:  Yes.    Pre-feed weight:  4210  g  (9 lb. 4.5  oz.) Post-feed weight:  4286  g (9 lb. 7.2  oz.) Amount transferred:  76  ml Amount supplemented:  None    Additional Feeding Assessment -  See above note   Infant's oral assessment:  Variance  Positioning:  Cross cradle Right breast  LATCH documentation:  Latch:  2 = Grasps breast easily, tongue down, lips flanged, rhythmical sucking.  Audible swallowing:  2 = Spontaneous and intermittent  Type of nipple:  2 = Everted at rest and after stimulation  Comfort (Breast/Nipple):  1 = Filling, red/small blisters or bruises, mild/mod discomfort  Hold (Positioning):  2 = No assistance needed to correctly position infant at breast  LATCH score: 9 - using the cross cradle at 1st and switching to the cradle and depth improved greatly,                                             increased swallows and consistency with feeding pattern.  Attached assessment:  Deep  Lips flanged:  Yes.    Lips untucked:  Yes.    Suck assessment:  Nutritive  Tools:  Nipple shield 24 mm Instructed on use and cleaning of tool:  Yes.    Pre-feed weight:  4286 g  (9 lb. 7.2  oz.) Post-feed weight:  4324  g (9 lb. 8.5  oz.) Amount transferred:  38  ml Amount supplemented:  None   Re-latched the baby in a football on the right side , and he latched with some fussing at 1st and only stayed latched for <5 mins , no - reweigh   Total amount pumped post feed: none   Total amount transferred: 114  ml Total supplement given:  None   Lactation Consultant's impression - Mom is working really hard breast feeding her baby with a Nipple shield ( LC changed size at consult )                                                               -  Also  mom has been doing all her extra pumping with hand pump ( a lot of work )                                                              - Sport and exercise psychologist is latching well with a Nipple shield and due to high palate, semi flat nipples will have to continue using it for latching                                                              - Mom is active with [employer] [facility] [place] ) , and was unaware of the loaner pump                                                               - Mom appears very tired, per mom had been up most of the night with the baby,                                                               - Mom also expressed feeling of being depressed , when asked "why she felt she was depressed, mom responded                                                                 "I find I want to stay in my bedroom with just the Baby " . Kathleen Lloyd asked mom is she had any feelings of harming herself , Baby , or any one else? Mom responded "no".                                                              -LC suggested being evaluated in [facility]U for PP Depression , or to call per OB. Due to the Baby being tx for [employer] . Mom called her OB ( Dr. Charlesetta Garibaldi [facility] )  And explained the need for yeast tx ( also S/s - areola and nipple itching, early sign) and feeling depressed. Apt today at 1315 pm , per mom with Dr. Charlesetta Garibaldi.                                                              - Mom expressed feeling of feeling pleased the milk supply was ok , and baby was doing better than she thought.                                                              - LC highly recommended the BFSG , weekly and the Feelings after birth support group on Tuesday am 1000 am at [facility].                                   LC plan             - Mom - rest , naps , plenty fluids, nutritious snacks and meals.                                                              - Feedings -  every 2 1/2 -3 hours and on demand( with feeding cues )                                                              - Steps for latching - Breast massage , hand express, pre pump if needed, apply nipple shield and latch with breast compressions until the Baby is in a good pattern                                                             - Extra pumping - post pump 10 mins each breast , after feeding with hand pump for now , consider a [facility] loaner and post pump after 4 feedings a day 10 mins both breast together.                                   BF Goal - Protect established milk supply                                                   -  mom plans to come back to [facility] for [facility] loaner or to call [facility]

## 2014-07-19 ENCOUNTER — Encounter (HOSPITAL_BASED_OUTPATIENT_CLINIC_OR_DEPARTMENT_OTHER): Payer: Self-pay | Admitting: Anesthesiology

## 2014-07-19 ENCOUNTER — Encounter (HOSPITAL_BASED_OUTPATIENT_CLINIC_OR_DEPARTMENT_OTHER): Payer: Self-pay | Admitting: *Deleted

## 2014-07-19 ENCOUNTER — Other Ambulatory Visit (INDEPENDENT_AMBULATORY_CARE_PROVIDER_SITE_OTHER): Payer: Self-pay | Admitting: Surgery

## 2014-07-19 NOTE — H&P (Signed)
Kathleen Lloyd  Location: [facility] Patient #: [id number] DOB: Oct 26, 1995 Single / Language: Kathleen Lloyd / Race: White Female  History of Present Illness Adin Hector MD; 06/07/2014 12:56 PM) Patient words: f/u from last office visit right axillary pain swelling.  The patient is a 18 year old female who presents to discuss consultation. This patient is a 18 y.o.female who presents today for surgical evaluation at the request of Kathleen Lloyd, CNM / Dr Cletis Media. Reason for visit: Right axillary pain swelling Kathleen Lloyd female. In her third trimester pregnancy. Followed by [facility]. Noted swelling in her right armpit [place]. Had similar episode 3 years ago. Thought to be infection. Improved with oral doxycycline antibiotics. Went to the emergency room at [facility]. Improved. Noted swelling to physicians there. Surgical consultation recommended. She denies any history infections elsewhere. No MRSA. No falls or trauma. No hydradenitis. Denies any nipple discharge. Mild sensitivity was pregnancy but not severe. No history of breast masses. No history of fall or trauma. Denies any night sweats. No fevers chills or sweats. She comes in today with her future stepson. I saw June 2015. Recommended followup in a month. She was bedridden. She felt like it went down. However, she feels like it has gotten larger. Still sensitive to touch. She is overdue to deliver a child this week. Wished to be seen and see if something needed to happen.   Other Problems Kathleen Lloyd [place], [place]; 06/07/2014 12:09 PM) Depression  Past Surgical History Kathleen Lloyd [place], [place]; 06/07/2014 12:09 PM) No pertinent past surgical history  Diagnostic Studies History Kathleen Lloyd [place], [place]; 06/07/2014 12:09 PM) Colonoscopy never Mammogram never Pap Smear never  Allergies Kathleen Lloyd [place], MA; 06/07/2014 12:11 PM) No Known Drug Allergies09/16/2015  Medication History (Kathleen Spillers,  MA; 06/07/2014 12:12 PM) Prenatal Vitamins (Oral daily) Active. Medications Reconciled  Social History Kathleen Lloyd [place], [place]; 06/07/2014 12:09 PM) Caffeine use Carbonated beverages, Tea. No alcohol use No drug use Tobacco use Former smoker.  Family History Kathleen Lloyd [place], [place]; 06/07/2014 12:09 PM) Melanoma Father. Migraine Headache Father.  Pregnancy / Birth History Kathleen Lloyd [place], [place]; 06/07/2014 12:09 PM) Age at menarche 59 years. Contraceptive History Oral contraceptives. Gravida 1 Maternal age 76-20 Para 0 Regular periods  Review of Systems Kathleen Lloyd [place]; 06/07/2014 12:09 PM) General Not Present- Appetite Loss, Chills, Fatigue, Fever, Night Sweats, Weight Gain and Weight Loss. HEENT Present- Seasonal Allergies and Wears glasses/contact lenses. Not Present- Earache, Hearing Loss, Hoarseness, Nose Bleed, Oral Ulcers, Ringing in the Ears, Sinus Pain, Sore Throat, Visual Disturbances and Yellow Eyes. Respiratory Not Present- Bloody sputum, Chronic Cough, Difficulty Breathing, Snoring and Wheezing. Breast Not Present- Breast Mass, Breast Pain, Nipple Discharge and Skin Changes. Cardiovascular Not Present- Chest Pain, Difficulty Breathing Lying Down, Leg Cramps, Palpitations, Rapid Heart Rate, Shortness of Breath and Swelling of Extremities. Female Genitourinary Not Present- Frequency, Nocturia, Painful Urination, Pelvic Pain and Urgency. Musculoskeletal Not Present- Back Pain, Joint Pain, Joint Stiffness, Muscle Pain, Muscle Weakness and Swelling of Extremities. Neurological Not Present- Decreased Memory, Fainting, Headaches, Numbness, Seizures, Tingling, Tremor, Trouble walking and Weakness. Psychiatric Present- Depression. Not Present- Anxiety, Bipolar, Change in Sleep Pattern, Fearful and Frequent crying. Endocrine Not Present- Cold Intolerance, Excessive Hunger, Hair Changes, Heat Intolerance, Hot flashes and New Diabetes. Hematology Present- Gland problems. Not  Present- Easy Bruising, Excessive bleeding, HIV and Persistent Infections.   Vitals (Kathleen Spillers MA; 06/07/2014 12:10 PM) 06/07/2014 12:10 PM Weight: 155 lb Height: 61in Body Surface Area: 1.74 m Body Mass Index: 29.29 kg/m BP:  110/70 (Sitting, Left Arm, Standard)    Physical Exam Adin Hector MD; 06/07/2014 12:32 PM) General Mental Status-Alert. General Appearance-Not in acute distress. Voice-Normal.  Integumentary Global Assessment Upon inspection and palpation of skin surfaces of the - Distribution of scalp and body hair is normal. General Characteristics Overall examination of the patient's skin reveals - no rashes and no suspicious lesions. Note: 7x5cm soft ellipsoid mass in the axilla. Larger but definitely softer. Not fluctuant. Mildly tender.   Head and Neck Head-normocephalic, atraumatic with no lesions or palpable masses. Face Global Assessment - atraumatic, no absence of expression. Neck Global Assessment - no abnormal movements, no decreased range of motion. Trachea-midline. Thyroid Gland Characteristics - non-tender.  Eye Eyeball - Left-Extraocular movements intact, No Nystagmus. Eyeball - Right-Extraocular movements intact, No Nystagmus. Upper Eyelid - Left-No Cyanotic. Upper Eyelid - Right-No Cyanotic.  Chest and Lung Exam Inspection Accessory muscles - No use of accessory muscles in breathing.  Abdomen Note: Gravid term pregnancy. probable small umbilical hernia. Reducible   Peripheral Vascular Upper Extremity Inspection - Left - Not Gangrenous, No Petechiae. Right - Not Gangrenous, No Petechiae.  Neurologic Neurologic evaluation reveals -normal attention span and ability to concentrate, able to name objects and repeat phrases. Appropriate fund of knowledge and normal coordination.  Neuropsychiatric Mental status exam performed with findings of-able to articulate well with normal speech/language, rate,  volume and coherence and no evidence of hallucinations, delusions, obsessions or homicidal/suicidal ideation. Orientation-oriented X3.  Musculoskeletal Global Assessment Gait and Station - normal gait and station.  Lymphatic General Lymphatics Description - No Generalized lymphadenopathy.    Assessment & Plan Adin Hector MD; 06/07/2014 12:29 PM) AXILLARY MASS, RIGHT (782.2  R22.31) Impression: Because the mass is larger and this persisted greater than 3 months, I feel this needs to be excised. It seems more soft to me like a lipoma. Lymph node or cyst less likely.  Because it has been going on for several months, I think it is safe to wait until after her delivery of baby is done (she is overdue already) and she has one-2 months to recover from that. I do not want to wait another 3-6 months. She agrees. Current Plans  Schedule for Surgery:  The pathophysiology of skin & subcutaneous masses was discussed. Natural history risks without surgery were discussed. I recommended surgery to remove the mass. I explained the technique of removal with use of local anesthesia & possible need for more aggressive sedation/anesthesia for patient comfort.  Risks such as bleeding, infection, heart attack, death, and other risks were discussed. I noted a good likelihood this will help address the problem. Possibility that this will not correct all symptoms was explained. Possibility of regrowth/recurrence of the mass was discussed. We will work to minimize complications. Questions were answered. The patient expresses understanding & wishes to proceed with surgery.   Signed by Adin Hector, MD

## 2014-07-19 NOTE — Progress Notes (Addendum)
Bring all medications. Spoke with Dr. Linna Caprice about history - would like Pregnancy test done. Pt to come in for urine pregnancy this week.

## 2014-07-21 ENCOUNTER — Encounter (HOSPITAL_BASED_OUTPATIENT_CLINIC_OR_DEPARTMENT_OTHER)
Admission: RE | Admit: 2014-07-21 | Discharge: 2014-07-21 | Disposition: A | Discharge status: Home / Self Care | Payer: Medicaid Other | Source: Ambulatory Visit | Attending: Surgery | Admitting: Surgery

## 2014-07-21 DIAGNOSIS — Z01812 Encounter for preprocedural laboratory examination: Secondary | ICD-10-CM | POA: Diagnosis not present

## 2014-07-21 LAB — PREGNANCY, URINE: PREG TEST UR: NEGATIVE

## 2014-07-24 ENCOUNTER — Encounter (HOSPITAL_BASED_OUTPATIENT_CLINIC_OR_DEPARTMENT_OTHER): Payer: Self-pay | Admitting: *Deleted

## 2014-07-25 ENCOUNTER — Encounter (HOSPITAL_COMMUNITY)
Admission: RE | Disposition: A | Discharge status: Home / Self Care | Payer: Self-pay | Source: Ambulatory Visit | Attending: Surgery

## 2014-07-25 ENCOUNTER — Encounter (HOSPITAL_COMMUNITY): Payer: Self-pay | Admitting: Certified Registered"

## 2014-07-25 ENCOUNTER — Ambulatory Visit (HOSPITAL_COMMUNITY): Payer: Medicaid Other | Admitting: Certified Registered"

## 2014-07-25 ENCOUNTER — Ambulatory Visit (HOSPITAL_BASED_OUTPATIENT_CLINIC_OR_DEPARTMENT_OTHER)
Admission: RE | Admit: 2014-07-25 | Discharge: 2014-07-25 | Disposition: A | Discharge status: Home / Self Care | Payer: Medicaid Other | Source: Ambulatory Visit | Attending: Surgery | Admitting: Surgery

## 2014-07-25 DIAGNOSIS — N6021 Fibroadenosis of right breast: Secondary | ICD-10-CM | POA: Diagnosis not present

## 2014-07-25 DIAGNOSIS — Z87891 Personal history of nicotine dependence: Secondary | ICD-10-CM | POA: Diagnosis not present

## 2014-07-25 DIAGNOSIS — R222 Localized swelling, mass and lump, trunk: Secondary | ICD-10-CM | POA: Diagnosis present

## 2014-07-25 HISTORY — DX: Major depressive disorder, single episode, unspecified: F32.9

## 2014-07-25 HISTORY — DX: Depression, unspecified: F32.A

## 2014-07-25 HISTORY — PX: MASS EXCISION: SHX2000

## 2014-07-25 SURGERY — EXCISION MASS
Anesthesia: General | Laterality: Right

## 2014-07-25 SURGERY — EXCISION MASS
Anesthesia: General | Site: Axilla | Laterality: Right

## 2014-07-25 MED ORDER — BUPIVACAINE-EPINEPHRINE 0.25% -1:200000 IJ SOLN
INTRAMUSCULAR | Status: DC | PRN
  Administered 2014-07-25: 60 mL

## 2014-07-25 MED ORDER — OXYCODONE HCL 5 MG/5ML PO SOLN
5.0000 mg | Freq: Once | ORAL | Status: DC | PRN
  Filled 2014-07-25: qty 5

## 2014-07-25 MED ORDER — LACTATED RINGERS IV SOLN
INTRAVENOUS | Status: DC | PRN
  Administered 2014-07-25: 14:00:00 via INTRAVENOUS

## 2014-07-25 MED ORDER — OXYCODONE HCL 5 MG PO TABS: 5.0000 mg | ORAL_TABLET | Freq: Once | ORAL | Status: DC | PRN

## 2014-07-25 MED ORDER — HYDROCODONE-ACETAMINOPHEN 5-325 MG PO TABS: 1.0000 | ORAL_TABLET | ORAL | Status: DC | PRN

## 2014-07-25 MED ORDER — LACTATED RINGERS IV SOLN: INTRAVENOUS | Status: DC

## 2014-07-25 MED ORDER — ACETAMINOPHEN 325 MG PO TABS: 325.0000 mg | ORAL_TABLET | ORAL | Status: DC | PRN

## 2014-07-25 MED ORDER — CEFAZOLIN SODIUM-DEXTROSE 2-3 GM-% IV SOLR
INTRAVENOUS | Status: AC
  Filled 2014-07-25: qty 50

## 2014-07-25 MED ORDER — MIDAZOLAM HCL 2 MG/2ML IJ SOLN
INTRAMUSCULAR | Status: AC
  Filled 2014-07-25: qty 2

## 2014-07-25 MED ORDER — ONDANSETRON HCL 4 MG/2ML IJ SOLN
INTRAMUSCULAR | Status: AC
  Filled 2014-07-25: qty 2

## 2014-07-25 MED ORDER — DEXAMETHASONE SODIUM PHOSPHATE 10 MG/ML IJ SOLN
INTRAMUSCULAR | Status: AC
  Filled 2014-07-25: qty 1

## 2014-07-25 MED ORDER — PROMETHAZINE HCL 25 MG/ML IJ SOLN
6.2500 mg | INTRAMUSCULAR | Status: DC | PRN
  Administered 2014-07-25: 6.25 mg via INTRAVENOUS

## 2014-07-25 MED ORDER — CEFAZOLIN SODIUM-DEXTROSE 2-3 GM-% IV SOLR
2.0000 g | INTRAVENOUS | Status: AC
  Administered 2014-07-25: 2 g via INTRAVENOUS

## 2014-07-25 MED ORDER — 0.9 % SODIUM CHLORIDE (POUR BTL) OPTIME
TOPICAL | Status: DC | PRN
  Administered 2014-07-25: 1000 mL

## 2014-07-25 MED ORDER — CHLORHEXIDINE GLUCONATE 4 % EX LIQD: 1.0000 "application " | Freq: Once | CUTANEOUS | Status: DC

## 2014-07-25 MED ORDER — ACETAMINOPHEN 160 MG/5ML PO SOLN
325.0000 mg | ORAL | Status: DC | PRN
  Filled 2014-07-25: qty 20.3

## 2014-07-25 MED ORDER — BUPIVACAINE-EPINEPHRINE (PF) 0.25% -1:200000 IJ SOLN
INTRAMUSCULAR | Status: AC
  Filled 2014-07-25: qty 30

## 2014-07-25 MED ORDER — FENTANYL CITRATE 0.05 MG/ML IJ SOLN
INTRAMUSCULAR | Status: AC
  Filled 2014-07-25: qty 2

## 2014-07-25 MED ORDER — PROPOFOL 10 MG/ML IV BOLUS
INTRAVENOUS | Status: AC
  Filled 2014-07-25: qty 20

## 2014-07-25 MED ORDER — PROPOFOL 10 MG/ML IV BOLUS
INTRAVENOUS | Status: DC | PRN
  Administered 2014-07-25: 70 mg via INTRAVENOUS
  Administered 2014-07-25: 130 mg via INTRAVENOUS

## 2014-07-25 MED ORDER — LIDOCAINE HCL (CARDIAC) 20 MG/ML IV SOLN
INTRAVENOUS | Status: AC
  Filled 2014-07-25: qty 5

## 2014-07-25 MED ORDER — PROMETHAZINE HCL 25 MG/ML IJ SOLN
INTRAMUSCULAR | Status: AC
  Filled 2014-07-25: qty 1

## 2014-07-25 MED ORDER — DEXAMETHASONE SODIUM PHOSPHATE 10 MG/ML IJ SOLN
INTRAMUSCULAR | Status: DC | PRN
  Administered 2014-07-25: 10 mg via INTRAVENOUS

## 2014-07-25 MED ORDER — LIDOCAINE HCL (CARDIAC) 20 MG/ML IV SOLN
INTRAVENOUS | Status: DC | PRN
  Administered 2014-07-25: 30 mg via INTRAVENOUS

## 2014-07-25 MED ORDER — ONDANSETRON HCL 4 MG/2ML IJ SOLN
INTRAMUSCULAR | Status: DC | PRN
  Administered 2014-07-25: 4 mg via INTRAVENOUS

## 2014-07-25 MED ORDER — FENTANYL CITRATE 0.05 MG/ML IJ SOLN
25.0000 ug | INTRAMUSCULAR | Status: DC | PRN
  Administered 2014-07-25: 25 ug via INTRAVENOUS

## 2014-07-25 MED ORDER — MIDAZOLAM HCL 5 MG/5ML IJ SOLN
INTRAMUSCULAR | Status: DC | PRN
  Administered 2014-07-25: 2 mg via INTRAVENOUS

## 2014-07-25 MED ORDER — FENTANYL CITRATE 0.05 MG/ML IJ SOLN
INTRAMUSCULAR | Status: DC | PRN
  Administered 2014-07-25 (×4): 50 ug via INTRAVENOUS

## 2014-07-25 SURGICAL SUPPLY — 37 items
BENZOIN TINCTURE PRP APPL 2/3 (GAUZE/BANDAGES/DRESSINGS) IMPLANT
BLADE HEX COATED 2.75 (ELECTRODE) ×3 IMPLANT
CANISTER SUCTION 2500CC (MISCELLANEOUS) IMPLANT
CHLORAPREP W/TINT 10.5 ML (MISCELLANEOUS) ×3 IMPLANT
DECANTER SPIKE VIAL GLASS SM (MISCELLANEOUS) IMPLANT
DERMABOND ADVANCED (GAUZE/BANDAGES/DRESSINGS) ×2
DERMABOND ADVANCED .7 DNX12 (GAUZE/BANDAGES/DRESSINGS) ×1 IMPLANT
DRAIN CHANNEL 19F RND (DRAIN) ×3 IMPLANT
DRAPE LAPAROTOMY T 102X78X121 (DRAPES) IMPLANT
DRAPE LAPAROTOMY TRNSV 102X78 (DRAPE) ×3 IMPLANT
ELECT REM PT RETURN 9FT ADLT (ELECTROSURGICAL) ×3
ELECTRODE REM PT RTRN 9FT ADLT (ELECTROSURGICAL) ×1 IMPLANT
EVACUATOR SILICONE 100CC (DRAIN) ×3 IMPLANT
GAUZE SPONGE 4X4 12PLY STRL (GAUZE/BANDAGES/DRESSINGS) ×3 IMPLANT
GLOVE BIOGEL PI IND STRL 7.0 (GLOVE) ×1 IMPLANT
GLOVE BIOGEL PI IND STRL 7.5 (GLOVE) ×1 IMPLANT
GLOVE BIOGEL PI INDICATOR 7.0 (GLOVE) ×2
GLOVE BIOGEL PI INDICATOR 7.5 (GLOVE) ×2
GLOVE ECLIPSE 8.0 STRL XLNG CF (GLOVE) ×3 IMPLANT
GLOVE INDICATOR 8.0 STRL GRN (GLOVE) ×3 IMPLANT
GLOVE SURG SS PI 6.5 STRL IVOR (GLOVE) ×3 IMPLANT
GOWN STRL REUS W/TWL LRG LVL3 (GOWN DISPOSABLE) ×3 IMPLANT
GOWN STRL REUS W/TWL XL LVL3 (GOWN DISPOSABLE) ×6 IMPLANT
KIT BASIN OR (CUSTOM PROCEDURE TRAY) ×3 IMPLANT
NEEDLE HYPO 22GX1.5 SAFETY (NEEDLE) ×3 IMPLANT
NS IRRIG 1000ML POUR BTL (IV SOLUTION) ×3 IMPLANT
PACK GENERAL/GYN (CUSTOM PROCEDURE TRAY) ×3 IMPLANT
PEN SKIN MARKING BROAD (MISCELLANEOUS) IMPLANT
SPONGE DRAIN TRACH 4X4 STRL 2S (GAUZE/BANDAGES/DRESSINGS) ×3 IMPLANT
SPONGE LAP 4X18 X RAY DECT (DISPOSABLE) IMPLANT
SUT MNCRL AB 4-0 PS2 18 (SUTURE) IMPLANT
SUT SILK 2 0 SH (SUTURE) ×3 IMPLANT
SUT VIC AB 3-0 SH 27 (SUTURE)
SUT VIC AB 3-0 SH 27XBRD (SUTURE) IMPLANT
SUT VIC AB 3-0 SH 8-18 (SUTURE) ×6 IMPLANT
SYR 20CC LL (SYRINGE) ×3 IMPLANT
TOWEL OR 17X26 10 PK STRL BLUE (TOWEL DISPOSABLE) ×3 IMPLANT

## 2014-07-25 NOTE — Interval H&P Note (Signed)
History and Physical Interval Note:  07/25/2014 2:10 PM  Kathleen Lloyd  has presented today for surgery, with the diagnosis of Right Axilla Mass  The various methods of treatment have been discussed with the patient and family. After consideration of risks, benefits and other options for treatment, the patient has consented to  Procedure(s): REMOVAL OF RIGHT AXILLA MASS (N/A) as a surgical intervention .  The patient's history has been reviewed, patient examined, no change in status, stable for surgery.  I have reviewed the patient's chart and labs.  Questions were answered to the patient's satisfaction.     Payton Moder C.

## 2014-07-25 NOTE — Op Note (Signed)
07/25/2014  4:05 PM  PATIENT:  Kathleen Lloyd  18 y.o. female  Patient Care Team: No Pcp Per Patient as PCP - General (General Practice)  PRE-OPERATIVE DIAGNOSIS:  Right Axilla Mass  POST-OPERATIVE DIAGNOSIS:  Right Axilla Mass, probable lipoma  PROCEDURE:  Procedure(s): REMOVAL OF RIGHT AXILLARY MASS  SURGEON:  Surgeon(s): Michael Boston, MD  ASSISTANT: RN   ANESTHESIA:   local and general  EBL:     Delay start of Pharmacological VTE agent (>24hrs) due to surgical blood loss or risk of bleeding:  no  DRAINS: (14 Fr) Blake drain(s) in the right axilla   SPECIMEN:  Source of Specimen:  Right axilla contents  DISPOSITION OF SPECIMEN:  PATHOLOGY  COUNTS:  YES  PLAN OF CARE: Discharge to home after PACU  PATIENT DISPOSITION:  PACU - hemodynamically stable.  INDICATION: pleasant female with persistent mass and right armpit.  Uncomfortable.  Has persisted for many months.  Initially seemed firm but now seems consistent with a lipoma.  She thinks this got larger.  I offered excision:  The pathophysiology of skin & subcutaneous masses was discussed.  Natural history risks without surgery were discussed.  I recommended surgery to remove the mass.  I explained the technique of removal with use of local anesthesia & possible need for more aggressive sedation/anesthesia for patient comfort.    Risks such as bleeding, infection, wound breakdown, heart attack, death, and other risks were discussed.  I noted a good likelihood this will help address the problem.   Possibility that this will not correct all symptoms was explained. Possibility of regrowth/recurrence of the mass was discussed.  We will work to minimize complications. Questions were answered.  The patient expresses understanding & wishes to proceed with surgery.   OR FINDINGS: lymph fluid fatty mass 7 x 5 x 2 cm poorly defined and densely adherent to the dermis of the axillary skin.  Most c/w lipoma  DESCRIPTION: informed  consent was confirmed.  Patient underwent general anesthesia without difficulty.  Her right axilla and chest were prepped and draped in a sterile fashion.  Surgical timeout confirmed our plan.  I made a curvilinear incision just inferior to the axillary hairline.  Through dermis.  Encountered a poorly defined fibrofatty mass adherent to the axillary contents.  I was able to gradually free this off using focused blunt and cautery dissection off the right pectoralis, latissimus dorsi and chest wall.  I took care to avoid the major nerves.  I was able to mobilize up towards the proximal inner arm.  It seemed mainly a fatty lipomatous mass.  Poorly defined margin between the lipoma and the dermis.  I freed the fibrofatty mass this off the dermis of the skin.  I did have small skin opening in getting the mass out.  I removed a few additional areas to have a good smooth resection off the dermal skin flap of the axilla,  I irrigated copiously.  Hemostasis was good.  I had a good resection off the dermis and chest wall.  Rest of the axillary contents seemed soft and normal.   I had placed a drain as noted above along the anterior axillary line at the inferior mammary fold.  Secured the skin with a silk 2-0 stitch.  I closed the wounds using 3-0 Vicryl interrupted deep dermal stitches a running 4-0 Monocryl stitch.   Adin Hector, M.D., F.A.C.S. Gastrointestinal and Minimally Invasive Surgery [facility] [address], [place],  [place] [id number] [phone] Main / Paging

## 2014-07-25 NOTE — Discharge Instructions (Signed)
DRAIN CARE:   You have a closed bulb drain to help you heal.  A bulb drain is a small, plastic reservoir which creates a gentle suction. It is used to remove excess fluid from a surgical wound. The color and amount of fluid will vary. Immediately after surgery, the fluid is bright red. It may gradually change to a yellow color. When the amount decreases to about 1 or 2 tablespoons (15 to 30 cc) per 24 hours, your caregiver will usually remove it.  DAILY CARE  Keep the bulb compressed at all times, except while emptying it. The compression creates suction.   Keep sites where the tubes enter the skin dry and covered with a light bandage (dressing).   Tape the tubes to your skin, 1 to 2 inches below the insertion sites, to keep from pulling on your stitches. Tubes are stitched in place and will not slip out.   Pin the bulb to your shirt (not to your pants) with a safety pin.   For the first few days after surgery, there usually is more fluid in the bulb. Empty the bulb whenever it becomes half full because the bulb does not create enough suction if it is too full. Include this amount in your 24 hour totals.   When the amount of drainage decreases, empty the bulb at the same time every day. Write down the amounts and the 24 hour totals. Your caregiver will want to know them. This helps your caregiver know when the tubes can be removed.   (We anticipate removing the drain in 1-3 weeks, depending on when the output is <20mL a day for 2+ days)  If there is drainage around the tube sites, change dressings and keep the area dry. If you see a clot in the tube, leave it alone. However, if the tube does not appear to be draining, let your caregiver know.  TO EMPTY THE BULB  Open the stopper to release suction.   Holding the stopper out of the way, pour drainage into the measuring cup that was sent home with you.   Measure and write down the amount. If there are 2 bulbs, note the amount of drainage  from bulb 1 or bulb 2 and keep the totals separate. Your caregiver will want to know which tube is draining more.   Compress the bulb by folding it in half.   Replace the stopper.   Check the tape that holds the tube to your skin, and pin the bulb to your shirt.  SEEK MEDICAL CARE IF:  The drainage develops a bad odor.   You have an oral temperature above 102 F (38.9 C).   The amount of drainage from your wound suddenly increases or decreases.   You accidentally pull out your drain.   You have any other questions or concerns.  MAKE SURE YOU:   Understand these instructions.   Will watch your condition.   Will get help right away if you are not doing well or get worse.     Call our office if you have any questions about your drain. [phone]    GENERAL SURGERY: POST OP INSTRUCTIONS  1. DIET: Follow a light bland diet the first 24 hours after arrival home, such as soup, liquids, crackers, etc.  Be sure to include lots of fluids daily.  Avoid fast food or heavy meals as your are more likely to get nauseated.   2. Take your usually prescribed home medications unless otherwise directed. 3.  PAIN CONTROL: a. Pain is best controlled by a usual combination of three different methods TOGETHER: i. Ice/Heat ii. Over the counter pain medication iii. Prescription pain medication b. Most patients will experience some swelling and bruising around the incisions.  Ice packs or heating pads (30-60 minutes up to 6 times a day) will help. Use ice for the first few days to help decrease swelling and bruising, then switch to heat to help relax tight/sore spots and speed recovery.  Some people prefer to use ice alone, heat alone, alternating between ice & heat.  Experiment to what works for you.  Swelling and bruising can take several weeks to resolve.   c. It is helpful to take an over-the-counter pain medication regularly for the first few weeks.  Choose one of the following that works best  for you: i. Naproxen (Aleve, etc)  Two 220mg  tabs twice a day ii. Ibuprofen (Advil, etc) Three 200mg  tabs four times a day (every meal & bedtime) iii. Acetaminophen (Tylenol, etc) 500-650mg  four times a day (every meal & bedtime) d. A  prescription for pain medication (such as oxycodone, hydrocodone, etc) should be given to you upon discharge.  Take your pain medication as prescribed.  i. If you are having problems/concerns with the prescription medicine (does not control pain, nausea, vomiting, rash, itching, etc), please call us [phone] to see if we need to switch you to a different pain medicine that will work better for you and/or control your side effect better. ii. If you need a refill on your pain medication, please contact your pharmacy.  They will contact our office to request authorization. Prescriptions will not be filled after 5 pm or on week-ends. 4. Avoid getting constipated.  Between the surgery and the pain medications, it is common to experience some constipation.  Increasing fluid intake and taking a fiber supplement (such as Metamucil, Citrucel, FiberCon, MiraLax, etc) 1-2 times a day regularly will usually help prevent this problem from occurring.  A mild laxative (prune juice, Milk of Magnesia, MiraLax, etc) should be taken according to package directions if there are no bowel movements after 48 hours.   5. Wash / shower every day.  You may shower over the dressings as they are waterproof.  Continue to shower over incision(s) after the dressing is off. 6. Remove your waterproof bandages 5 days after surgery.  You may leave the incision open to air.  You may have skin tapes (Steri Strips) covering the incision(s).  Leave them on until one week, then remove.  You may replace a dressing/Band-Aid to cover the incision for comfort if you wish.      7. ACTIVITIES as tolerated:   a. You may resume regular (light) daily activities beginning the next day--such as daily self-care,  walking, climbing stairs--gradually increasing activities as tolerated.  If you can walk 30 minutes without difficulty, it is safe to try more intense activity such as jogging, treadmill, bicycling, low-impact aerobics, swimming, etc. b. Save the most intensive and strenuous activity for last such as sit-ups, heavy lifting, contact sports, etc  Refrain from any heavy lifting or straining until you are off narcotics for pain control.   c. DO NOT PUSH THROUGH PAIN.  Let pain be your guide: If it hurts to do something, don't do it.  Pain is your body warning you to avoid that activity for another week until the pain goes down. d. You may drive when you are no longer taking prescription pain medication, you  can comfortably wear a seatbelt, and you can safely maneuver your car and apply brakes. e. Dennis Bast may have sexual intercourse when it is comfortable.  8. FOLLOW UP in our office a. Please call [facility] at [phone] to set up an appointment to see your surgeon in the office for a follow-up appointment approximately 2-3 weeks after your surgery. b. Make sure that you call for this appointment the day you arrive home to insure a convenient appointment time. 9. IF YOU HAVE DISABILITY OR FAMILY LEAVE FORMS, BRING THEM TO THE OFFICE FOR PROCESSING.  DO NOT GIVE THEM TO YOUR DOCTOR.   WHEN TO CALL us [phone]: 1. Poor pain control 2. Reactions / problems with new medications (rash/itching, nausea, etc)  3. Fever over 101.5 F (38.5 C) 4. Worsening swelling or bruising 5. Continued bleeding from incision. 6. Increased pain, redness, or drainage from the incision 7. Difficulty breathing / swallowing   The clinic staff is available to answer your questions during regular business hours (8:30am-5pm).  Please dont hesitate to call and ask to speak to one of our nurses for clinical concerns.   If you have a medical emergency, go to the nearest emergency room or call 911.  A surgeon from [facility]  Surgery is always on call at the [facility], [place], [place], [place], [place]  [address] ? MAIN: [phone] ? TOLL FREE: [phone] ?  FAX [phone] [email or web address]  Managing Pain  Pain after surgery or related to activity is often due to strain/injury to muscle, tendon, nerves and/or incisions.  This pain is usually short-term and will improve in a few months.   Many people find it helpful to do the following things TOGETHER to help speed the process of healing and to get back to regular activity more quickly:  1. Avoid heavy physical activity a.  no lifting greater than 20 pounds b. Do not push through the pain.  Listen to your body and avoid positions and maneuvers than reproduce the pain c. Walking is okay as tolerated, but go slowly and stop when getting sore.  d. Remember: If it hurts to do it, then dont do it! 2. Take Anti-inflammatory medication  a. Take with food/snack around the clock for 1-2 weeks i. This helps the muscle and nerve tissues become less irritable and calm down faster b. Choose ONE of the following over-the-counter medications: i. Naproxen 220mg  tabs (ex. Aleve) 1-2 pills twice a day  ii. Ibuprofen 200mg  tabs (ex. Advil, Motrin) 3-4 pills with every meal and just before bedtime iii. Acetaminophen 500mg  tabs (Tylenol) 1-2 pills with every meal and just before bedtime 3. Use a Heating pad or Ice/Cold Pack a. 4-6 times a day b. May use warm bath/hottub  or showers 4. Try Gentle Massage and/or Stretching  a. at the area of pain many times a day b. stop if you feel pain - do not overdo it  Try these steps together to help you body heal faster and avoid making things get worse.  Doing just one of these things may not be enough.    If you are not getting better after two weeks or are noticing you are getting worse, contact our office for further advice; we may need to re-evaluate you & see  what other things we can do to help.    General Anesthesia, Care After Refer to this sheet in the next few weeks. These instructions provide you  with information on caring for yourself after your procedure. Your health care provider may also give you more specific instructions. Your treatment has been planned according to current medical practices, but problems sometimes occur. Call your health care provider if you have any problems or questions after your procedure. WHAT TO EXPECT AFTER THE PROCEDURE After the procedure, it is typical to experience:  Sleepiness.  Nausea and vomiting. HOME CARE INSTRUCTIONS  For the first 24 hours after general anesthesia:  Have a responsible person with you.  Do not drive a car. If you are alone, do not take public transportation.  Do not drink alcohol.  Do not take medicine that has not been prescribed by your health care provider.  Do not sign important papers or make important decisions.  You may resume a normal diet and activities as directed by your health care provider.  Change bandages (dressings) as directed.  If you have questions or problems that seem related to general anesthesia, call the hospital and ask for the anesthetist or anesthesiologist on call. SEEK MEDICAL CARE IF:  You have nausea and vomiting that continue the day after anesthesia.  You develop a rash. SEEK IMMEDIATE MEDICAL CARE IF:   You have difficulty breathing.  You have chest pain.  You have any allergic problems. Document Released: 12/15/2000 Document Revised: 09/13/2013 Document Reviewed: 03/24/2013 [facility] Patient Information 2015 [place], [place]. This information is not intended to replace advice given to you by your health care provider. Make sure you discuss any questions you have with your health care provider.

## 2014-07-25 NOTE — Transfer of Care (Signed)
Immediate Anesthesia Transfer of Care Note  Patient: Kathleen Lloyd  Procedure(s) Performed: Procedure(s) (LRB): REMOVAL OF RIGHT AXILLA MASS (Right)  Patient Location: PACU  Anesthesia Type: General  Level of Consciousness: sedated, patient cooperative and responds to stimulation  Airway & Oxygen Therapy: Patient Spontanous Breathing and Patient connected to face mask oxgen  Post-op Assessment: Report given to PACU RN and Post -op Vital signs reviewed and stable  Post vital signs: Reviewed and stable  Complications: No apparent anesthesia complications

## 2014-07-25 NOTE — H&P (View-Only) (Signed)
Kathleen Lloyd  Location: [facility] Surgery Patient #: [id number] DOB: [birth year] Single / Language: [name] / Race: White Female  History of Present Illness Kathleen Hector MD; 9/16/Lloyd 12:56 PM) Patient words: f/u from last office visit right axillary pain swelling.  The patient is a 18 year old female who presents to discuss consultation. This patient is a 18 y.o.female who presents today for surgical evaluation at the request of Kathleen Lloyd, CNM / Dr Kathleen Lloyd. Reason for visit: Right axillary pain swelling Kathleen Lloyd female. In her third trimester pregnancy. Followed by [facility]. Noted swelling in her right armpit [place]. Had similar episode 3 years ago. Thought to be infection. Improved with oral doxycycline antibiotics. Went to the emergency room at [facility]. Improved. Noted swelling to physicians there. Surgical consultation recommended. She denies any history infections elsewhere. No MRSA. No falls or trauma. No hydradenitis. Denies any nipple discharge. Mild sensitivity was pregnancy but not severe. No history of breast masses. No history of fall or trauma. Denies any night sweats. No fevers chills or sweats. She comes in today with her future stepson. I saw Kathleen Lloyd. Recommended followup in a month. She was bedridden. She felt like it went down. However, she feels like it has gotten larger. Still sensitive to touch. She is overdue to deliver a child this week. Wished to be seen and see if something needed to happen.   Other Problems Kathleen Mage Sperry, Lloyd; 9/16/Lloyd 12:09 PM) Depression  Past Surgical History Kathleen Mage Star, Lloyd; 9/16/Lloyd 12:09 PM) No pertinent past surgical history  Diagnostic Studies History Kathleen Mage [place], Lloyd; 9/16/Lloyd 12:09 PM) Colonoscopy never Mammogram never Pap Smear never  Allergies Kathleen Mage Spillers, Lloyd; 9/16/Lloyd 12:11 PM) No Known Drug Allergies09/16/Lloyd  Medication History (Kathleen Spillers,  Lloyd; 9/16/Lloyd 12:12 PM) Prenatal Vitamins (Oral daily) Active. Medications Reconciled  Social History Kathleen Lloyd, [place]; 9/16/Lloyd 12:09 PM) Caffeine use Carbonated beverages, Tea. No alcohol use No drug use Tobacco use Former smoker.  Family History Kathleen Lloyd, [place]; 9/16/Lloyd 12:09 PM) Melanoma Father. Migraine Headache Father.  Pregnancy / Birth History Kathleen Lloyd, [place]; 9/16/Lloyd 12:09 PM) Age at menarche 80 years. Contraceptive History Oral contraceptives. Gravida 1 Maternal age 33-20 Para 0 Regular periods  Review of Systems Kathleen Mage Turner Lloyd; 9/16/Lloyd 12:09 PM) General Not Present- Appetite Loss, Chills, Fatigue, Fever, Night Sweats, Weight Gain and Weight Loss. HEENT Present- Seasonal Allergies and Wears glasses/contact lenses. Not Present- Earache, Hearing Loss, Hoarseness, Nose Bleed, Oral Ulcers, Ringing in the Ears, Sinus Pain, Sore Throat, Visual Disturbances and Yellow Eyes. Respiratory Not Present- Bloody sputum, Chronic Cough, Difficulty Breathing, Snoring and Wheezing. Breast Not Present- Breast Mass, Breast Pain, Nipple Discharge and Skin Changes. Cardiovascular Not Present- Chest Pain, Difficulty Breathing Lying Down, Leg Cramps, Palpitations, Rapid Heart Rate, Shortness of Breath and Swelling of Extremities. Female Genitourinary Not Present- Frequency, Nocturia, Painful Urination, Pelvic Pain and Urgency. Musculoskeletal Not Present- Back Pain, Joint Pain, Joint Stiffness, Muscle Pain, Muscle Weakness and Swelling of Extremities. Neurological Not Present- Decreased Memory, Fainting, Headaches, Numbness, Seizures, Tingling, Tremor, Trouble walking and Weakness. Psychiatric Present- Depression. Not Present- Anxiety, Bipolar, Change in Sleep Pattern, Fearful and Frequent crying. Endocrine Not Present- Cold Intolerance, Excessive Hunger, Hair Changes, Heat Intolerance, Hot flashes and New Diabetes. Hematology Present- Gland problems. Not  Present- Easy Bruising, Excessive bleeding, HIV and Persistent Infections.   Vitals (Kathleen Lloyd; 9/16/Lloyd 12:10 PM) 9/16/Lloyd 12:10 PM Weight: 155 lb Height: 61in Body Surface Area: 1.74 m Body Mass Index: 29.29 kg/m BP:  110/70 (Sitting, Left Arm, Standard)    Physical Exam Kathleen Hector MD; 9/16/Lloyd 12:32 PM) General Mental Status-Alert. General Appearance-Not in acute distress. Voice-Normal.  Integumentary Global Assessment Upon inspection and palpation of skin surfaces of the - Distribution of scalp and body hair is normal. General Characteristics Overall examination of the patient's skin reveals - no rashes and no suspicious lesions. Note: 7x5cm soft ellipsoid mass in the axilla. Larger but definitely softer. Not fluctuant. Mildly tender.   Head and Neck Head-normocephalic, atraumatic with no lesions or palpable masses. Face Global Assessment - atraumatic, no absence of expression. Neck Global Assessment - no abnormal movements, no decreased range of motion. Trachea-midline. Thyroid Gland Characteristics - non-tender.  Eye Eyeball - Left-Extraocular movements intact, No Nystagmus. Eyeball - Right-Extraocular movements intact, No Nystagmus. Upper Eyelid - Left-No Cyanotic. Upper Eyelid - Right-No Cyanotic.  Chest and Lung Exam Inspection Accessory muscles - No use of accessory muscles in breathing.  Abdomen Note: Gravid term pregnancy. probable small umbilical hernia. Reducible   Peripheral Vascular Upper Extremity Inspection - Left - Not Gangrenous, No Petechiae. Right - Not Gangrenous, No Petechiae.  Neurologic Neurologic evaluation reveals -normal attention span and ability to concentrate, able to name objects and repeat phrases. Appropriate fund of knowledge and normal coordination.  Neuropsychiatric Mental status exam performed with findings of-able to articulate well with normal speech/language, rate,  volume and coherence and no evidence of hallucinations, delusions, obsessions or homicidal/suicidal ideation. Orientation-oriented X3.  Musculoskeletal Global Assessment Gait and Lloyd - normal gait and Lloyd.  Lymphatic General Lymphatics Description - No Generalized lymphadenopathy.    Assessment & Plan Kathleen Hector MD; 9/16/Lloyd 12:29 PM) AXILLARY MASS, RIGHT (782.2  R22.31) Impression: Because the mass is larger and this persisted greater than 3 months, I feel this needs to be excised. It seems more soft to me like a lipoma. Lymph node or cyst less likely.  Because it has been going on for several months, I think it is safe to wait until after her delivery of baby is done (she is overdue already) and she has one-2 months to recover from that. I do not want to wait another 3-6 months. She agrees. Current Plans  Schedule for Surgery:  The pathophysiology of skin & subcutaneous masses was discussed. Natural history risks without surgery were discussed. I recommended surgery to remove the mass. I explained the technique of removal with use of local anesthesia & possible need for more aggressive sedation/anesthesia for patient comfort.  Risks such as bleeding, infection, heart attack, death, and other risks were discussed. I noted a good likelihood this will help address the problem. Possibility that this will not correct all symptoms was explained. Possibility of regrowth/recurrence of the mass was discussed. We will work to minimize complications. Questions were answered. The patient expresses understanding & wishes to proceed with surgery.   Signed by Kathleen Hector, MD

## 2014-07-25 NOTE — Anesthesia Preprocedure Evaluation (Signed)
Anesthesia Evaluation  Patient identified by MRN, date of birth, ID band Patient awake    Reviewed: Allergy & Precautions, H&P , NPO status , Patient's Chart, lab work & pertinent test results  History of Anesthesia Complications Negative for: history of anesthetic complications  Airway Mallampati: I  TM Distance: >3 FB Neck ROM: Full    Dental  (+) Partial Upper,    Pulmonary neg pulmonary ROS,  breath sounds clear to auscultation        Cardiovascular negative cardio ROS  Rhythm:Regular     Neuro/Psych PSYCHIATRIC DISORDERS Depression negative neurological ROS     GI/Hepatic negative GI ROS, Neg liver ROS,   Endo/Other  negative endocrine ROS  Renal/GU negative Renal ROS     Musculoskeletal   Abdominal   Peds  Hematology negative hematology ROS (+)   Anesthesia Other Findings   Reproductive/Obstetrics 6 weeks post partum                             Anesthesia Physical Anesthesia Plan  ASA: II  Anesthesia Plan: General   Post-op Pain Management:    Induction: Intravenous  Airway Management Planned: LMA  Additional Equipment: None  Intra-op Plan:   Post-operative Plan: Extubation in OR  Informed Consent: I have reviewed the patients History and Physical, chart, labs and discussed the procedure including the risks, benefits and alternatives for the proposed anesthesia with the patient or authorized representative who has indicated his/her understanding and acceptance.   Dental advisory given  Plan Discussed with: CRNA and Surgeon  Anesthesia Plan Comments:         Anesthesia Quick Evaluation

## 2014-07-26 ENCOUNTER — Encounter (HOSPITAL_COMMUNITY): Payer: Self-pay | Admitting: Surgery

## 2014-07-28 NOTE — Anesthesia Postprocedure Evaluation (Signed)
  Anesthesia Post-op Note  Patient: Kathleen Lloyd  Procedure(s) Performed: Procedure(s): REMOVAL OF RIGHT AXILLA MASS (Right)  Patient Location: PACU  Anesthesia Type:General  Level of Consciousness: awake  Airway and Oxygen Therapy: Patient Spontanous Breathing  Post-op Pain: mild  Post-op Assessment: Post-op Vital signs reviewed, Patient's Cardiovascular Status Stable, Respiratory Function Stable, Patent Airway, No signs of Nausea or vomiting and Pain level controlled  Post-op Vital Signs: Reviewed and stable  Last Vitals:  Filed Vitals:   07/25/14 1802  BP: 109/61  Pulse: 87  Temp:   Resp: 18    Complications: No apparent anesthesia complications

## 2014-10-04 ENCOUNTER — Encounter (HOSPITAL_COMMUNITY): Payer: Self-pay | Admitting: *Deleted

## 2014-10-04 ENCOUNTER — Emergency Department (INDEPENDENT_AMBULATORY_CARE_PROVIDER_SITE_OTHER)
Admission: EM | Admit: 2014-10-04 | Discharge: 2014-10-04 | Disposition: A | Discharge status: Home / Self Care | Payer: Self-pay | Source: Home / Self Care | Attending: Emergency Medicine | Admitting: Emergency Medicine

## 2014-10-04 DIAGNOSIS — J329 Chronic sinusitis, unspecified: Secondary | ICD-10-CM

## 2014-10-04 DIAGNOSIS — R0982 Postnasal drip: Secondary | ICD-10-CM

## 2014-10-04 DIAGNOSIS — J029 Acute pharyngitis, unspecified: Secondary | ICD-10-CM

## 2014-10-04 LAB — POCT RAPID STREP A: STREPTOCOCCUS, GROUP A SCREEN (DIRECT): NEGATIVE

## 2014-10-04 MED ORDER — IPRATROPIUM BROMIDE 0.06 % NA SOLN: 2.0000 | NASAL | Status: DC | PRN

## 2014-10-04 MED ORDER — AMOXICILLIN-POT CLAVULANATE 875-125 MG PO TABS: 1.0000 | ORAL_TABLET | Freq: Two times a day (BID) | ORAL | Status: DC

## 2014-10-04 NOTE — ED Provider Notes (Signed)
CSN: [id number]     Arrival date & time 10/04/14  1229 History   First MD Initiated Contact with Patient 10/04/14 1242     Chief Complaint  Patient presents with  . Sore Throat   (Consider location/radiation/quality/duration/timing/severity/associated sxs/prior Treatment) HPI       19 year old female presents complaining of sore throat. For 4 days she has had pain in the right side of her throat. It is worse with swallowing. She also has mild right-sided sinus pressure and right-sided ear pain. She's been taking over-the-counter cough and cold medicines without relief. She denies fever, chills, NVD, recent travel, sick contacts. She has a 79-month-old child, she is not breast-feeding   Past Medical History  Diagnosis Date  . UTI (lower urinary tract infection)   . Depression   . Vaginal delivery 05/2014   Past Surgical History  Procedure Laterality Date  . Mass excision Right 07/25/2014    Procedure: REMOVAL OF RIGHT AXILLA MASS;  Surgeon: Michael Boston, MD;  Location: [facility];  Service: General;  Laterality: Right;   Family History  Problem Relation Age of Onset  . Cancer Father     melanoma   History  Substance Use Topics  . Smoking status: Never Smoker   . Smokeless tobacco: Never Used  . Alcohol Use: No   OB History    Gravida Para Term Preterm AB TAB SAB Ectopic Multiple Living   1 1 1       1      Review of Systems  Constitutional: Negative for fever and chills.  HENT: Positive for postnasal drip, rhinorrhea, sinus pressure and sore throat. Negative for congestion and ear pain.   Respiratory: Negative for cough.   All other systems reviewed and are negative.   Allergies  Review of patient's allergies indicates no known allergies.  Home Medications   Prior to Admission medications   Medication Sig Start Date End Date Taking? Authorizing Provider  amoxicillin-clavulanate (AUGMENTIN) 875-125 MG per tablet Take 1 tablet by mouth every 12 (twelve) hours. 10/04/14    Liam Graham, PA-C  buPROPion (WELLBUTRIN XL) 150 MG 24 hr tablet Take 150 mg by mouth daily.    Historical Provider, MD  HYDROcodone-acetaminophen (NORCO/VICODIN) 5-325 MG per tablet Take 1-2 tablets by mouth every 4 (four) hours as needed for moderate pain or severe pain. 07/25/14   Michael Boston, MD  ibuprofen (ADVIL,MOTRIN) 600 MG tablet Take 1 tablet (600 mg total) by mouth every 6 (six) hours. 06/13/14   Venus Standard, CNM  ipratropium (ATROVENT) 0.06 % nasal spray Place 2 sprays into both nostrils every 4 (four) hours as needed for rhinitis. 10/04/14   Freeman Caldron Quame Spratlin, PA-C  nystatin ointment (MYCOSTATIN) Apply 1 application topically 2 (two) times daily.    Historical Provider, MD  Prenatal Vit-Fe Fumarate-FA (PRENATAL MULTIVITAMIN) TABS tablet Take 1 tablet by mouth daily.     Historical Provider, MD   BP 121/80 mmHg  Pulse 74  Temp(Src) 98.5 F (36.9 C) (Oral)  Resp 18  SpO2 98% Physical Exam  Constitutional: She is oriented to person, place, and time. Vital signs are normal. She appears well-developed and well-nourished. No distress.  HENT:  Head: Normocephalic and atraumatic.  Right Ear: External ear normal.  Left Ear: External ear normal.  Nose: Mucosal edema and rhinorrhea present. Right sinus exhibits maxillary sinus tenderness and frontal sinus tenderness.  Mouth/Throat: Uvula is midline, oropharynx is clear and moist and mucous membranes are normal. No oropharyngeal exudate or posterior oropharyngeal erythema.  Cobblestoning of the posterior oropharynx  Eyes: Conjunctivae are normal. Right eye exhibits no discharge. Left eye exhibits no discharge.  Neck: Normal range of motion. Neck supple.  Cardiovascular: Normal rate, regular rhythm and normal heart sounds.   Pulmonary/Chest: Breath sounds normal. No respiratory distress.  Lymphadenopathy:    She has no cervical adenopathy.  Neurological: She is alert and oriented to person, place, and time. She has normal strength.  Coordination normal.  Skin: Skin is warm and dry. No rash noted. She is not diaphoretic.  Psychiatric: She has a normal mood and affect. Judgment normal.  Nursing note and vitals reviewed.   ED Course  Procedures (including critical care time) Labs Review Labs Reviewed  POCT RAPID STREP A ([facility] URG CARE ONLY)    Imaging Review No results found.   MDM   1. Sore throat   2. Post-nasal drainage   3. Other sinusitis    Treat symptomatically for now. Start antibiotics if no improvement in one week. Follow-up when necessary  Meds ordered this encounter  Medications  . ipratropium (ATROVENT) 0.06 % nasal spray    Sig: Place 2 sprays into both nostrils every 4 (four) hours as needed for rhinitis.    Dispense:  15 mL    Refill:  1    Order Specific Question:  Supervising Provider    Answer:  Jake Michaelis, DAVID C [id number]  . amoxicillin-clavulanate (AUGMENTIN) 875-125 MG per tablet    Sig: Take 1 tablet by mouth every 12 (twelve) hours.    Dispense:  14 tablet    Refill:  0    Order Specific Question:  Supervising Provider    Answer:  Jake Michaelis, DAVID C [[id number]]       Liam Graham, PA-C 10/04/14 1356

## 2014-10-04 NOTE — Discharge Instructions (Signed)
Pharyngitis Pharyngitis is redness, pain, and swelling (inflammation) of your pharynx.  CAUSES  Pharyngitis is usually caused by infection. Most of the time, these infections are from viruses (viral) and are part of a cold. However, sometimes pharyngitis is caused by bacteria (bacterial). Pharyngitis can also be caused by allergies. Viral pharyngitis may be spread from person to person by coughing, sneezing, and personal items or utensils (cups, forks, spoons, toothbrushes). Bacterial pharyngitis may be spread from person to person by more intimate contact, such as kissing.  SIGNS AND SYMPTOMS  Symptoms of pharyngitis include:   Sore throat.   Tiredness (fatigue).   Low-grade fever.   Headache.  Joint pain and muscle aches.  Skin rashes.  Swollen lymph nodes.  Plaque-like film on throat or tonsils (often seen with bacterial pharyngitis). DIAGNOSIS  Your health care provider will ask you questions about your illness and your symptoms. Your medical history, along with a physical exam, is often all that is needed to diagnose pharyngitis. Sometimes, a rapid strep test is done. Other lab tests may also be done, depending on the suspected cause.  TREATMENT  Viral pharyngitis will usually get better in 3-4 days without the use of medicine. Bacterial pharyngitis is treated with medicines that kill germs (antibiotics).  HOME CARE INSTRUCTIONS   Drink enough water and fluids to keep your urine clear or pale yellow.   Only take over-the-counter or prescription medicines as directed by your health care provider:   If you are prescribed antibiotics, make sure you finish them even if you start to feel better.   Do not take aspirin.   Get lots of rest.   Gargle with 8 oz of salt water ( tsp of salt per 1 qt of water) as often as every 1-2 hours to soothe your throat.   Throat lozenges (if you are not at risk for choking) or sprays may be used to soothe your throat. SEEK MEDICAL  CARE IF:   You have large, tender lumps in your neck.  You have a rash.  You cough up green, yellow-brown, or bloody spit. SEEK IMMEDIATE MEDICAL CARE IF:   Your neck becomes stiff.  You drool or are unable to swallow liquids.  You vomit or are unable to keep medicines or liquids down.  You have severe pain that does not go away with the use of recommended medicines.  You have trouble breathing (not caused by a stuffy nose). MAKE SURE YOU:   Understand these instructions.  Will watch your condition.  Will get help right away if you are not doing well or get worse. Document Released: 09/08/2005 Document Revised: 06/29/2013 Document Reviewed: 05/16/2013 [facility] Patient Information 2015 [place], [place]. This information is not intended to replace advice given to you by your health care provider. Make sure you discuss any questions you have with your health care provider.  Sinusitis Sinusitis is redness, soreness, and inflammation of the paranasal sinuses. Paranasal sinuses are air pockets within the bones of your face (beneath the eyes, the middle of the forehead, or above the eyes). In healthy paranasal sinuses, mucus is able to drain out, and air is able to circulate through them by way of your nose. However, when your paranasal sinuses are inflamed, mucus and air can become trapped. This can allow bacteria and other germs to grow and cause infection. Sinusitis can develop quickly and last only a short time (acute) or continue over a long period (chronic). Sinusitis that lasts for more than 12 weeks is  considered chronic.  CAUSES  Causes of sinusitis include:  Allergies.  Structural abnormalities, such as displacement of the cartilage that separates your nostrils (deviated septum), which can decrease the air flow through your nose and sinuses and affect sinus drainage.  Functional abnormalities, such as when the small hairs (cilia) that line your sinuses and help remove mucus do  not work properly or are not present. SIGNS AND SYMPTOMS  Symptoms of acute and chronic sinusitis are the same. The primary symptoms are pain and pressure around the affected sinuses. Other symptoms include:  Upper toothache.  Earache.  Headache.  Bad breath.  Decreased sense of smell and taste.  A cough, which worsens when you are lying flat.  Fatigue.  Fever.  Thick drainage from your nose, which often is green and may contain pus (purulent).  Swelling and warmth over the affected sinuses. DIAGNOSIS  Your health care provider will perform a physical exam. During the exam, your health care provider may:  Look in your nose for signs of abnormal growths in your nostrils (nasal polyps).  Tap over the affected sinus to check for signs of infection.  View the inside of your sinuses (endoscopy) using an imaging device that has a light attached (endoscope). If your health care provider suspects that you have chronic sinusitis, one or more of the following tests may be recommended:  Allergy tests.  Nasal culture. A sample of mucus is taken from your nose, sent to a lab, and screened for bacteria.  Nasal cytology. A sample of mucus is taken from your nose and examined by your health care provider to determine if your sinusitis is related to an allergy. TREATMENT  Most cases of acute sinusitis are related to a viral infection and will resolve on their own within 10 days. Sometimes medicines are prescribed to help relieve symptoms (pain medicine, decongestants, nasal steroid sprays, or saline sprays).  However, for sinusitis related to a bacterial infection, your health care provider will prescribe antibiotic medicines. These are medicines that will help kill the bacteria causing the infection.  Rarely, sinusitis is caused by a fungal infection. In theses cases, your health care provider will prescribe antifungal medicine. For some cases of chronic sinusitis, surgery is needed.  Generally, these are cases in which sinusitis recurs more than 3 times per year, despite other treatments. HOME CARE INSTRUCTIONS   Drink plenty of water. Water helps thin the mucus so your sinuses can drain more easily.  Use a humidifier.  Inhale steam 3 to 4 times a day (for example, sit in the bathroom with the shower running).  Apply a warm, moist washcloth to your face 3 to 4 times a day, or as directed by your health care provider.  Use saline nasal sprays to help moisten and clean your sinuses.  Take medicines only as directed by your health care provider.  If you were prescribed either an antibiotic or antifungal medicine, finish it all even if you start to feel better. SEEK IMMEDIATE MEDICAL CARE IF:  You have increasing pain or severe headaches.  You have nausea, vomiting, or drowsiness.  You have swelling around your face.  You have vision problems.  You have a stiff neck.  You have difficulty breathing. MAKE SURE YOU:   Understand these instructions.  Will watch your condition.  Will get help right away if you are not doing well or get worse. Document Released: 09/08/2005 Document Revised: 01/23/2014 Document Reviewed: 09/23/2011 [facility] Patient Information 2015 [place], [place]. This information  is not intended to replace advice given to you by your health care provider. Make sure you discuss any questions you have with your health care provider.  Salt Water Gargle This solution will help make your mouth and throat feel better. HOME CARE INSTRUCTIONS   Mix 1 teaspoon of salt in 8 ounces of warm water.  Gargle with this solution as much or often as you need or as directed. Swish and gargle gently if you have any sores or wounds in your mouth.  Do not swallow this mixture. Document Released: 06/12/2004 Document Revised: 12/01/2011 Document Reviewed: 11/03/2008 [facility] Patient Information 2015 [place], [place]. This information is not intended to replace  advice given to you by your health care provider. Make sure you discuss any questions you have with your health care provider.

## 2014-10-04 NOTE — ED Notes (Signed)
Pt  Reports   Symptoms  Of  sorethroat   And  Pain  When  She  Swallows   With  Symptoms      X  4  Days

## 2014-10-07 LAB — CULTURE, GROUP A STREP

## 2015-05-23 ENCOUNTER — Encounter (HOSPITAL_COMMUNITY): Payer: Self-pay | Admitting: *Deleted

## 2015-05-23 ENCOUNTER — Emergency Department (HOSPITAL_COMMUNITY)
Admission: EM | Admit: 2015-05-23 | Discharge: 2015-05-23 | Disposition: A | Discharge status: Home / Self Care | Payer: Medicaid Other | Attending: Emergency Medicine | Admitting: Emergency Medicine

## 2015-05-23 DIAGNOSIS — Z87440 Personal history of urinary (tract) infections: Secondary | ICD-10-CM | POA: Insufficient documentation

## 2015-05-23 DIAGNOSIS — Z791 Long term (current) use of non-steroidal anti-inflammatories (NSAID): Secondary | ICD-10-CM | POA: Insufficient documentation

## 2015-05-23 DIAGNOSIS — K088 Other specified disorders of teeth and supporting structures: Secondary | ICD-10-CM | POA: Diagnosis present

## 2015-05-23 DIAGNOSIS — F329 Major depressive disorder, single episode, unspecified: Secondary | ICD-10-CM | POA: Diagnosis not present

## 2015-05-23 DIAGNOSIS — Z79899 Other long term (current) drug therapy: Secondary | ICD-10-CM | POA: Insufficient documentation

## 2015-05-23 DIAGNOSIS — K029 Dental caries, unspecified: Secondary | ICD-10-CM | POA: Diagnosis not present

## 2015-05-23 DIAGNOSIS — K0889 Other specified disorders of teeth and supporting structures: Secondary | ICD-10-CM

## 2015-05-23 MED ORDER — CLINDAMYCIN HCL 150 MG PO CAPS: 150.0000 mg | ORAL_CAPSULE | Freq: Four times a day (QID) | ORAL | Status: DC

## 2015-05-23 NOTE — ED Notes (Signed)
PT went to her dentist on Tuesday 8-30 -16 for removal of tooth on left side. Pt's dentist started Pt on antibx and a mouth wash for pain. Pt also reported taking Advil for pain. Dental pain is worse today.

## 2015-05-23 NOTE — ED Notes (Signed)
Declined W/C at D/C and was escorted to lobby by RN. 

## 2015-05-23 NOTE — ED Provider Notes (Signed)
CSN: [id number]     Arrival date & time 05/23/15  8182 History   First MD Initiated Contact with Patient 05/23/15 [phone]     Chief Complaint  Patient presents with  . Dental Problem     (Consider location/radiation/quality/duration/timing/severity/associated sxs/prior Treatment) HPI Comments: Comes in with c/o left lower dental pain. She was supposed to have them pulled yesterday but her partials weren't done. She denies fever, trouble breathing or swallowing. She was put on amoxicillin  And a mouth rinse but her wound is still hurting.  The history is provided by the patient. No language interpreter was used.    Past Medical History  Diagnosis Date  . UTI (lower urinary tract infection)   . Depression   . Vaginal delivery 05/2014   Past Surgical History  Procedure Laterality Date  . Mass excision Right 07/25/2014    Procedure: REMOVAL OF RIGHT AXILLA MASS;  Surgeon: Michael Boston, MD;  Location: [facility];  Service: General;  Laterality: Right;   Family History  Problem Relation Age of Onset  . Cancer Father     melanoma   Social History  Substance Use Topics  . Smoking status: Never Smoker   . Smokeless tobacco: Never Used  . Alcohol Use: No   OB History    Gravida Para Term Preterm AB TAB SAB Ectopic Multiple Living   1 1 1       1      Review of Systems  All other systems reviewed and are negative.     Allergies  Review of patient's allergies indicates no known allergies.  Home Medications   Prior to Admission medications   Medication Sig Start Date End Date Taking? Authorizing Provider  amoxicillin-clavulanate (AUGMENTIN) 875-125 MG per tablet Take 1 tablet by mouth every 12 (twelve) hours. 10/04/14   Liam Graham, PA-C  buPROPion (WELLBUTRIN XL) 150 MG 24 hr tablet Take 150 mg by mouth daily.    Historical Provider, MD  clindamycin (CLEOCIN) 150 MG capsule Take 1 capsule (150 mg total) by mouth every 6 (six) hours. 05/23/15   Glendell Docker, NP    HYDROcodone-acetaminophen (NORCO/VICODIN) 5-325 MG per tablet Take 1-2 tablets by mouth every 4 (four) hours as needed for moderate pain or severe pain. 07/25/14   Michael Boston, MD  ibuprofen (ADVIL,MOTRIN) 600 MG tablet Take 1 tablet (600 mg total) by mouth every 6 (six) hours. 06/13/14   Venus Standard, CNM  ipratropium (ATROVENT) 0.06 % nasal spray Place 2 sprays into both nostrils every 4 (four) hours as needed for rhinitis. 10/04/14   Freeman Caldron Baker, PA-C  nystatin ointment (MYCOSTATIN) Apply 1 application topically 2 (two) times daily.    Historical Provider, MD  Prenatal Vit-Fe Fumarate-FA (PRENATAL MULTIVITAMIN) TABS tablet Take 1 tablet by mouth daily.     Historical Provider, MD   BP 126/84 mmHg  Pulse 82  Temp(Src) 98.6 F (37 C) (Oral)  Resp 16  SpO2 99%  LMP 05/16/2015  Breastfeeding? No Physical Exam  Constitutional: She is oriented to person, place, and time. She appears well-developed and well-nourished.  HENT:  Right Ear: External ear normal.  Left Ear: External ear normal.  Mouth/Throat:    Decayed teeth. No facial swelling  Cardiovascular: Normal rate and regular rhythm.   Pulmonary/Chest: Breath sounds normal.  Musculoskeletal: Normal range of motion.  Neurological: She is alert and oriented to person, place, and time.  Skin: Skin is warm and dry.  Nursing note and vitals reviewed.   ED Course  Procedures (including critical care time) Labs Review Labs Reviewed - No data to display  Imaging Review No results found. I have personally reviewed and evaluated these images and lab results as part of my medical decision-making.   EKG Interpretation None      MDM   Final diagnoses:  Toothache    Will switch to clinda. No sign of ludwigs angina    Glendell Docker, NP 05/23/15 [id number]  Evelina Bucy, MD 05/23/15 [phone]

## 2015-05-23 NOTE — Discharge Instructions (Signed)

## 2015-07-31 ENCOUNTER — Encounter (HOSPITAL_COMMUNITY): Payer: Self-pay | Admitting: Emergency Medicine

## 2015-07-31 ENCOUNTER — Emergency Department (HOSPITAL_COMMUNITY)
Admission: EM | Admit: 2015-07-31 | Discharge: 2015-07-31 | Disposition: A | Discharge status: Home / Self Care | Payer: Medicaid Other | Attending: Emergency Medicine | Admitting: Emergency Medicine

## 2015-07-31 DIAGNOSIS — Z79899 Other long term (current) drug therapy: Secondary | ICD-10-CM | POA: Insufficient documentation

## 2015-07-31 DIAGNOSIS — Z87440 Personal history of urinary (tract) infections: Secondary | ICD-10-CM | POA: Insufficient documentation

## 2015-07-31 DIAGNOSIS — Z8659 Personal history of other mental and behavioral disorders: Secondary | ICD-10-CM | POA: Insufficient documentation

## 2015-07-31 DIAGNOSIS — D229 Melanocytic nevi, unspecified: Secondary | ICD-10-CM | POA: Insufficient documentation

## 2015-07-31 DIAGNOSIS — L989 Disorder of the skin and subcutaneous tissue, unspecified: Secondary | ICD-10-CM | POA: Diagnosis present

## 2015-07-31 NOTE — ED Notes (Signed)
PA at bedside.

## 2015-07-31 NOTE — ED Provider Notes (Signed)
CSN: [id number]     Arrival date & time 07/31/15  0911 History   First MD Initiated Contact with Patient 07/31/15 [phone]     Chief Complaint  Patient presents with  . Skin Problem   HPI   19 year old female presents today with concerning mole to her back. Patient reports this area had been biopsied several years ago which showed no cancerous growth. She states that after biopsy returned and has stayed stable with no change in the last 3-4 years. Patient reports that several years ago she saw her primary care provider who thought that it might be cancerous, had a biopsy scheduled but called and canceled with her. She did not attempt to call the primary care for reevaluation. She reports that she went and saw another primary care who referred her to a dermatologist recently, but has not heard from the dermatologist and has not attempted making contact with the primary care. Patient reports that she is concerned that she's been told by numerous providers that this could be cancerous. Patient denies any fever, weight loss, night sweats, or any other concerning signs or symptoms. Patient reports that she recently had an upper respiratory infection that has resolved, but she still feels fatigued. Patient has numerous other moles on different areas of her body with different colors and sizes.    Past Medical History  Diagnosis Date  . UTI (lower urinary tract infection)   . Depression   . Vaginal delivery 05/2014   Past Surgical History  Procedure Laterality Date  . Mass excision Right 07/25/2014    Procedure: REMOVAL OF RIGHT AXILLA MASS;  Surgeon: Michael Boston, MD;  Location: [facility];  Service: General;  Laterality: Right;   Family History  Problem Relation Age of Onset  . Cancer Father     melanoma   Social History  Substance Use Topics  . Smoking status: Never Smoker   . Smokeless tobacco: Never Used  . Alcohol Use: Yes     Comment: occ   OB History    Gravida Para Term Preterm AB TAB SAB  Ectopic Multiple Living   1 1 1       1      Review of Systems  All other systems reviewed and are negative.   Allergies  Review of patient's allergies indicates no known allergies.  Home Medications   Prior to Admission medications   Medication Sig Start Date End Date Taking? Authorizing Provider  acetaminophen (TYLENOL) 500 MG tablet Take 1,000 mg by mouth every 6 (six) hours as needed for mild pain, moderate pain, fever or headache.   Yes Historical Provider, MD  cephALEXin (KEFLEX) 500 MG capsule Take 500 mg by mouth 2 (two) times daily. Started 11/01 for for 7 days   Yes Historical Provider, MD  norgestimate-ethinyl estradiol (MONONESSA) 0.25-35 MG-MCG tablet Take 1 tablet by mouth daily.   Yes Historical Provider, MD  clindamycin (CLEOCIN) 150 MG capsule Take 1 capsule (150 mg total) by mouth every 6 (six) hours. Patient not taking: Reported on 07/31/2015 05/23/15   Glendell Docker, NP  ipratropium (ATROVENT) 0.06 % nasal spray Place 2 sprays into both nostrils every 4 (four) hours as needed for rhinitis. Patient not taking: Reported on 07/31/2015 10/04/14   Liam Graham, PA-C   BP 114/58 mmHg  Pulse 87  Temp(Src) 98.1 F (36.7 C) (Oral)  Resp 18  Ht 5\' 1"  (1.549 m)  Wt 140 lb (63.504 kg)  BMI 26.47 kg/m2  SpO2 100%  LMP  07/09/2015 (Approximate)   Physical Exam  Constitutional: She is oriented to person, place, and time. She appears well-developed and well-nourished.  HENT:  Head: Normocephalic and atraumatic.  Eyes: Conjunctivae are normal. Pupils are equal, round, and reactive to light. Right eye exhibits no discharge. Left eye exhibits no discharge. No scleral icterus.  Neck: Normal range of motion. No JVD present. No tracheal deviation present.  Pulmonary/Chest: Effort normal. No stridor.  Neurological: She is alert and oriented to person, place, and time. Coordination normal.  Skin:  .9 cm nevus  Psychiatric: She has a normal mood and affect. Her behavior is  normal. Judgment and thought content normal.  Nursing note and vitals reviewed.         ED Course  Procedures (including critical care time) Labs Review Labs Reviewed - No data to display  Imaging Review No results found. I have personally reviewed and evaluated these images and lab results as part of my medical decision-making.   EKG Interpretation None      MDM   Final diagnoses:  Nevus    Labs:  Imaging:  Consults:  Therapeutics:  Discharge Meds:   Assessment/Plan: Pt presents with nevus to left back, its been biopsied before, has been present for 3-4 years with no growth. It appears symmetrical, has irregular borders, has not changed in size, is 0.9 cm in diameter. Patient is strongly encouraged contact the first primary care that was supposed to biopsy it, the second primary care who scheduled the dermatologist and see them this week for biopsy. Patient has no fever, night sweats, weight loss, or any other systemic symptoms that would indicate significant infection or malignancy. Patient has no history of skin cancer, although she does have family history. Both the patient and her husband understood the importance of biopsy and further evaluation, they verbalized her understanding and agreement to today's plan and had no further questions or concerns at the time of discharge.          Okey Regal, PA-C 07/31/15 [place], DO 07/31/15 1523

## 2015-07-31 NOTE — Discharge Instructions (Signed)
Excision of Skin Lesions °Excision of a skin lesion refers to the removal of a section of skin by making small cuts (incisions) in the skin. This procedure may be done to remove a cancerous (malignant) or noncancerous (benign) growth on the skin. It is typically done to treat or prevent cancer or infection. It may also be done to improve cosmetic appearance. °The procedure may be done to remove: °· Cancerous growths, such as basal cell carcinoma, squamous cell carcinoma, or melanoma. °· Noncancerous growths, such as a cyst or lipoma. °· Growths, such as moles or skin tags, which may be removed for cosmetic reasons. °Various excision or surgical techniques may be used depending on your condition, the location of the lesion, and your overall health. °LET YOUR HEALTH CARE PROVIDER KNOW ABOUT: °· Any allergies you have. °· All medicines you are taking, including vitamins, herbs, eye drops, creams, and over-the-counter medicines. °· Previous problems you or members of your family have had with the use of anesthetics. °· Any blood disorders you have. °· Previous surgeries you have had. °· Any medical conditions you have. °· Whether you are pregnant or may be pregnant. °RISKS AND COMPLICATIONS °Generally, this is a safe procedure. However, problems may occur, including: °· Bleeding. °· Infection. °· Scarring. °· Recurrence of the cyst, lipoma, or cancer. °· Changes in skin sensation or appearance, such as discoloration or swelling. °· Reaction to the anesthetics. °· Allergic reaction to surgical materials or ointments. °· Damage to nerves, blood vessels, muscles, or other structures. °· Continued pain. °BEFORE THE PROCEDURE °· Ask your health care provider about: °¨ Changing or stopping your regular medicines. This is especially important if you are taking diabetes medicines or blood thinners. °¨ Taking medicines such as aspirin and ibuprofen. These medicines can thin your blood. Do not take these medicines before your  procedure if your health care provider instructs you not to. °· You may be asked to take certain medicines. °· You may be asked to stop smoking. °· You may have an exam or testing. °· Plan to have someone take you home after the procedure. °· Plan to have someone help you with activities during recovery. °PROCEDURE °· To reduce your risk of infection: °¨ Your health care team will wash or sanitize their hands. °¨ Your skin will be washed with soap. °· You will be given a medicine to numb the area (local anesthetic). °· One of the following excision techniques will be performed. °· At the end of any of these procedures, antibiotic ointment will be applied as needed. °Each of the following techniques may vary among health care providers and hospitals. °Complete Surgical Excision °The area of skin that needs to be removed will be marked with a pen. Using a small scalpel or scissors, the surgeon will gently cut around and under the lesion until it is completely removed. The lesion will be placed in a fluid and sent to the lab for examination. If necessary, bleeding will be controlled with a device that delivers heat (electrocautery). The edges of the wound may be stitched (sutured) together, and a bandage (dressing) will be applied. This procedure may be performed to treat a cancerous growth or a noncancerous cyst or lesion. °Excision of a Cyst °The surgeon will make an incision on the cyst. The entire cyst will be removed through the incision. The incision may be closed with sutures. °Shave Excision °During shave excision, the surgeon will use a small blade or an electrically heated loop instrument to   shave off the lesion. This may be done to remove a mole or a skin tag. The wound will usually be left to heal on its own without sutures. Punch Excision During punch excision, the surgeon will use a small tool that is like a cookie cutter or a hole punch to cut a circle shape out of the skin. The outer edges of the skin  will be sutured together. This may be done to remove a mole or a scar or to perform a biopsy of the lesion. Mohs Micrographic Surgery During Mohs micrographic surgery, layers of the lesion will be removed with a scalpel or a loop instrument and will be examined right away under a microscope. Layers will be removed until all of the abnormal or cancerous tissue has been removed. This procedure is minimally invasive, and it ensures the best cosmetic outcome. It involves the removal of as little normal tissue as possible. Mohs is usually done to treat skin cancer, such as basal cell carcinoma or squamous cell carcinoma, particularly on the face and ears. Depending on the size of the surgical wound, it may be sutured closed. AFTER THE PROCEDURE  Return to your normal activities as told by your health care provider.  Talk with your health care provider to discuss any test results, treatment options, and if necessary, the need for more tests.   This information is not intended to replace advice given to you by your health care provider. Make sure you discuss any questions you have with your health care provider.   Document Released: 12/03/2009 Document Revised: 05/30/2015 Document Reviewed: 10/25/2014 Elsevier Interactive Patient Education 2016 [place] are usually harmless growths on the skin. They are accumulations of color (pigment) cells in the skin that:   Can be various colors, from light brown to black.  Can appear anywhere on the body.  May remain flat or become raised.  May contain hairs.  May remain smooth or develop wrinkling. Most moles are not cancerous (benign). However, some moles may develop changes and become cancerous. It is important to check your moles every month. If you check your moles regularly, you will be able to notice any changes that may occur.  CAUSES  Moles occur when skin cells grow together in clusters instead of spreading out in the skin as  they normally do. The reason for this clustering is unknown. DIAGNOSIS  Your caregiver will perform a skin examination to diagnose your mole.  TREATMENT  Moles usually do not require treatment. If a mole becomes worrisome, your caregiver may choose to take a sample of the mole or remove it entirely, and then send it to a lab for examination.  HOME CARE INSTRUCTIONS  Check your mole(s) monthly for changes that may indicate skin cancer. These changes can include:  A change in size.  A change in color. Note that moles tend to darken during pregnancy or when taking birth control pills (oral contraception).  A change in shape.  A change in the border of the mole.  Wear sunscreen (with an SPF of at least 15) when you spend long periods of time outside. Reapply the sunscreen every 2-3 hours.  Schedule annual appointments with your skin doctor (dermatologist) if you have a large number of moles. SEEK MEDICAL CARE IF:  Your mole changes size, especially if it becomes larger than a pencil eraser.  Your mole changes in color or develops more than one color.  Your mole becomes itchy or bleeds.  Your mole, or the skin near the mole, becomes painful, sore, red, or swollen.  Your mole becomes scaly, sheds skin, or oozes fluid.  Your mole develops irregular borders.  Your mole becomes flat or develops raised areas.  Your mole becomes hard or soft.   This information is not intended to replace advice given to you by your health care provider. Make sure you discuss any questions you have with your health care provider.   Document Released: 06/03/2001 Document Revised: 06/02/2012 Document Reviewed: 03/22/2012 Elsevier Interactive Patient Education 2016 [place].  Please contact Primary care provider and dermatologist immediately for follow-up evaluation and further management   Please read attached information. If you experience any new or worsening signs or symptoms please return  to the emergency room for evaluation. Please follow-up with your primary care provider or specialist as discussed. Please use medication prescribed only as directed and discontinue taking if you have any concerning signs or symptoms.

## 2015-07-31 NOTE — ED Notes (Addendum)
Patient states she has a spot on her back. Patient states she has been seen by 2 family doctors who believe it may be skin cancer. Patient was referred to dermatologist who "moved her appt but didn't tell her when it was moved to." Patient states she has not been feeling well. Patient reports spot has been present for 3-4 years, states she had it removed once before. Area of concern is on the left lower back, smaller than a dime.

## 2015-09-22 ENCOUNTER — Inpatient Hospital Stay (HOSPITAL_COMMUNITY)
Admission: AD | Admit: 2015-09-22 | Discharge: 2015-09-22 | Disposition: A | Discharge status: Home / Self Care | Payer: Medicaid Other | Source: Ambulatory Visit | Attending: Obstetrics and Gynecology | Admitting: Obstetrics and Gynecology

## 2015-09-22 ENCOUNTER — Inpatient Hospital Stay (HOSPITAL_COMMUNITY): Payer: Medicaid Other

## 2015-09-22 ENCOUNTER — Encounter (HOSPITAL_COMMUNITY): Payer: Self-pay

## 2015-09-22 DIAGNOSIS — R1032 Left lower quadrant pain: Secondary | ICD-10-CM | POA: Diagnosis not present

## 2015-09-22 DIAGNOSIS — Z975 Presence of (intrauterine) contraceptive device: Secondary | ICD-10-CM | POA: Insufficient documentation

## 2015-09-22 DIAGNOSIS — R102 Pelvic and perineal pain: Secondary | ICD-10-CM

## 2015-09-22 DIAGNOSIS — F329 Major depressive disorder, single episode, unspecified: Secondary | ICD-10-CM | POA: Insufficient documentation

## 2015-09-22 DIAGNOSIS — R1031 Right lower quadrant pain: Secondary | ICD-10-CM | POA: Diagnosis not present

## 2015-09-22 LAB — URINALYSIS, ROUTINE W REFLEX MICROSCOPIC
Bilirubin Urine: NEGATIVE
GLUCOSE, UA: NEGATIVE mg/dL
HGB URINE DIPSTICK: NEGATIVE
Ketones, ur: NEGATIVE mg/dL
LEUKOCYTES UA: NEGATIVE
Nitrite: NEGATIVE
Protein, ur: NEGATIVE mg/dL
SPECIFIC GRAVITY, URINE: 1.01 (ref 1.005–1.030)
pH: 6 (ref 5.0–8.0)

## 2015-09-22 LAB — WET PREP, GENITAL
CLUE CELLS WET PREP: NONE SEEN
SPERM: NONE SEEN
TRICH WET PREP: NONE SEEN
Yeast Wet Prep HPF POC: NONE SEEN

## 2015-09-22 LAB — CBC
HCT: 39.7 % (ref 36.0–46.0)
Hemoglobin: 12.5 g/dL (ref 12.0–15.0)
MCH: 28.5 pg (ref 26.0–34.0)
MCHC: 31.5 g/dL (ref 30.0–36.0)
MCV: 90.4 fL (ref 78.0–100.0)
PLATELETS: 185 10*3/uL (ref 150–400)
RBC: 4.39 MIL/uL (ref 3.87–5.11)
RDW: 14.2 % (ref 11.5–15.5)
WBC: 5.2 10*3/uL (ref 4.0–10.5)

## 2015-09-22 LAB — POCT PREGNANCY, URINE: Preg Test, Ur: NEGATIVE

## 2015-09-22 MED ORDER — KETOROLAC TROMETHAMINE 30 MG/ML IJ SOLN
30.0000 mg | Freq: Once | INTRAMUSCULAR | Status: AC
  Administered 2015-09-22: 30 mg via INTRAMUSCULAR
  Filled 2015-09-22: qty 1

## 2015-09-22 MED ORDER — IBUPROFEN 800 MG PO TABS: 800.0000 mg | ORAL_TABLET | Freq: Three times a day (TID) | ORAL | Status: DC | PRN

## 2015-09-22 NOTE — MAU Note (Signed)
Notified provider that patient has an IUD that was placed 12/5 and c/o of RLQ pain and orange discharge since last night. Provider said she would be down to see patient.

## 2015-09-22 NOTE — MAU Provider Note (Signed)
History    19 yo, G1 P1, LMP 09/08/15  Presents to the MAU announced c/o RLQ pelvic pain with orange discharge since last night.  Pt history significant for IUD placement on 08/29/15.      Patient Active Problem List   Diagnosis Date Noted  . Vaginal delivery 06/11/2014  . Cellulitis of axilla, right 03/15/2014    Chief Complaint  Patient presents with  . Abdominal Pain   HPI  OB History    Gravida Para Term Preterm AB TAB SAB Ectopic Multiple Living   1 1 1       1       Past Medical History  Diagnosis Date  . UTI (lower urinary tract infection)   . Depression   . Vaginal delivery 05/2014    Past Surgical History  Procedure Laterality Date  . Mass excision Right 07/25/2014    Procedure: REMOVAL OF RIGHT AXILLA MASS;  Surgeon: Michael Boston, MD;  Location: [facility];  Service: General;  Laterality: Right;    Family History  Problem Relation Age of Onset  . Cancer Father     melanoma    Social History  Substance Use Topics  . Smoking status: Never Smoker   . Smokeless tobacco: Never Used  . Alcohol Use: Yes     Comment: occ    Allergies: No Known Allergies  Prescriptions prior to admission  Medication Sig Dispense Refill Last Dose  . azithromycin (ZITHROMAX) 250 MG tablet Take 250 mg by mouth daily.   09/20/2015  . ibuprofen (ADVIL,MOTRIN) 400 MG tablet Take 400 mg by mouth every 6 (six) hours as needed for fever or moderate pain.   09/21/2015 at Unknown time  . clindamycin (CLEOCIN) 150 MG capsule Take 1 capsule (150 mg total) by mouth every 6 (six) hours. (Patient not taking: Reported on 07/31/2015) 28 capsule 0 Not Taking at Unknown time  . ipratropium (ATROVENT) 0.06 % nasal spray Place 2 sprays into both nostrils every 4 (four) hours as needed for rhinitis. (Patient not taking: Reported on 07/31/2015) 15 mL 1 Not Taking at Unknown time    ROS Physical Exam   Blood pressure 103/66, pulse 90, temperature 98 F (36.7 C), temperature source Oral, resp. rate  18, last menstrual period 09/08/2015, not currently breastfeeding.   Results for orders placed or performed during the hospital encounter of 09/22/15 (from the past 24 hour(s))  Urinalysis, Routine w reflex microscopic (not at [facility])     Status: None   Collection Time: 09/22/15 12:53 PM  Result Value Ref Range   Color, Urine YELLOW YELLOW   APPearance CLEAR CLEAR   Specific Gravity, Urine 1.010 1.005 - 1.030   pH 6.0 5.0 - 8.0   Glucose, UA NEGATIVE NEGATIVE mg/dL   Hgb urine dipstick NEGATIVE NEGATIVE   Bilirubin Urine NEGATIVE NEGATIVE   Ketones, ur NEGATIVE NEGATIVE mg/dL   Protein, ur NEGATIVE NEGATIVE mg/dL   Nitrite NEGATIVE NEGATIVE   Leukocytes, UA NEGATIVE NEGATIVE  Pregnancy, urine POC     Status: None   Collection Time: 09/22/15  1:12 PM  Result Value Ref Range   Preg Test, Ur NEGATIVE NEGATIVE  Wet prep, genital     Status: Abnormal   Collection Time: 09/22/15  3:08 PM  Result Value Ref Range   Yeast Wet Prep HPF POC NONE SEEN NONE SEEN   Trich, Wet Prep NONE SEEN NONE SEEN   Clue Cells Wet Prep HPF POC NONE SEEN NONE SEEN   WBC, Wet Prep HPF  POC FEW (A) NONE SEEN   Sperm NONE SEEN   CBC     Status: None   Collection Time: 09/22/15  4:55 PM  Result Value Ref Range   WBC 5.2 4.0 - 10.5 K/uL   RBC 4.39 3.87 - 5.11 MIL/uL   Hemoglobin 12.5 12.0 - 15.0 g/dL   HCT 39.7 36.0 - 46.0 %   MCV 90.4 78.0 - 100.0 fL   MCH 28.5 26.0 - 34.0 pg   MCHC 31.5 30.0 - 36.0 g/dL   RDW 14.2 11.5 - 15.5 %   Platelets 185 150 - 400 K/uL      Physical Exam  Constitutional: She is oriented to person, place, and time. She appears well-developed and well-nourished.  HENT:  Head: Normocephalic.  Eyes: Pupils are equal, round, and reactive to light.  Neck: Normal range of motion.  Cardiovascular: Normal rate and regular rhythm.   Respiratory: Effort normal.  GI: Soft.  Genitourinary: Uterus is tender. Cervix exhibits friability. Cervix exhibits no discharge. Right adnexum displays  tenderness. Left adnexum displays tenderness. There is bleeding in the vagina. Vaginal discharge found.  Musculoskeletal: Normal range of motion.  Neurological: She is alert and oriented to person, place, and time. She has normal reflexes.  Skin: Skin is warm and dry.  Psychiatric: She has a normal mood and affect. Her behavior is normal. Judgment and thought content normal.    ED Course  Assessment: Right & Left Adnexal tenderness  No CMT noted Discharge present - Friable cervix IUD strings visibile IUD placement verified by [place] UPT negative Wet Prep negative  CBC - wnl,  US - wnl   Plan: IM Toradol for pain Discharge home RX Ibuprofen, 800mg  PO, Q6hrs PRN Keep scheduled [facility] appt on 09/25/14 Call if symptoms worsen   Lavetta Nielsen CNM, MSN 09/22/2015 3:01 PM

## 2015-09-22 NOTE — MAU Note (Signed)
IUD placed around 12/5, started having severe RLQ pain last night.  Also noted orange discharge.  Pain continues today, no discharge today.

## 2015-09-22 NOTE — Discharge Instructions (Signed)
Pelvic Pain, Female Pelvic pain is pain felt below the belly button and between your hips. It can be caused by many different things. It is important to get help right away. This is especially true for severe, sharp, or unusual pain that comes on suddenly.  HOME CARE  Only take medicine as told by your doctor.  Rest as told by your doctor.  Eat a healthy diet, such as fruits, vegetables, and lean meats.  Drink enough fluids to keep your pee (urine) clear or pale yellow, or as told.  Avoid sex (intercourse) if it causes pain.  Apply warm or cold packs to your lower belly (abdomen). Use the type of pack that helps the pain.  Avoid situations that cause you stress.  Keep a journal to track your pain. Write down:  When the pain started.  Where it is located.  If there are things that seem to be related to the pain, such as food or your period.  Follow up with your doctor as told. GET HELP RIGHT AWAY IF:   You have heavy bleeding from the vagina.  You have more pelvic pain.  You feel lightheaded or pass out (faint).  You have chills.  You have pain when you pee or have blood in your pee.  You cannot stop having watery poop (diarrhea).  You cannot stop throwing up (vomiting).  You have a fever or lasting symptoms for more than 3 days.  You have a fever and your symptoms suddenly get worse.  You are being physically or sexually abused.  Your medicine does not help your pain.  You have fluid (discharge) coming from your vagina that is not normal. MAKE SURE YOU:  Understand these instructions.  Will watch your condition.  Will get help if you are not doing well or get worse.   This information is not intended to replace advice given to you by your health care provider. Make sure you discuss any questions you have with your health care provider.   Document Released: 02/25/2008 Document Revised: 09/29/2014 Document Reviewed: 12/29/2011 Elsevier Interactive Patient  Education 2016 Elsevier Inc[employer]  Ovarian Cyst An ovarian cyst is a sac filled with fluid or blood. This sac is attached to the ovary. Some cysts go away on their own. Other cysts need treatment.  HOME CARE   Only take medicine as told by your doctor.  Follow up with your doctor as told.  Get regular pelvic exams and Pap tests. GET HELP IF:  Your periods are late, not regular, or painful.  You stop having periods.  Your belly (abdominal) or pelvic pain does not go away.  Your belly becomes large or puffy (swollen).  You have a hard time peeing (totally emptying your bladder).  You have pressure on your bladder.  You have pain during sex.  You feel fullness, pressure, or discomfort in your belly.  You lose weight for no reason.  You feel sick most of the time.  You have a hard time pooping (constipation).  You do not feel like eating.  You develop pimples (acne).  You have an increase in hair on your body and face.  You are gaining weight for no reason.  You think you are pregnant. GET HELP RIGHT AWAY IF:   Your belly pain gets worse.  You feel sick to your stomach (nauseous), and you throw up (vomit).  You have a fever that comes on fast.  You have belly pain while pooping (bowel movement).  Your  periods are heavier than usual. MAKE SURE YOU:   Understand these instructions.  Will watch your condition.  Will get help right away if you are not doing well or get worse.   This information is not intended to replace advice given to you by your health care provider. Make sure you discuss any questions you have with your health care provider.   Document Released: 02/25/2008 Document Revised: 06/29/2013 Document Reviewed: 05/16/2013 Elsevier Interactive Patient Education [employer].

## 2015-09-25 LAB — GC/CHLAMYDIA PROBE AMP (~~LOC~~) NOT AT ARMC
Chlamydia: NEGATIVE
Neisseria Gonorrhea: NEGATIVE

## 2015-12-07 ENCOUNTER — Inpatient Hospital Stay (HOSPITAL_COMMUNITY)
Admission: AD | Admit: 2015-12-07 | Discharge: 2015-12-07 | Discharge status: Against Medical Advice | Payer: Medicaid Other | Source: Ambulatory Visit | Attending: Obstetrics & Gynecology | Admitting: Obstetrics & Gynecology

## 2015-12-07 DIAGNOSIS — Z5320 Procedure and treatment not carried out because of patient's decision for unspecified reasons: Secondary | ICD-10-CM | POA: Insufficient documentation

## 2015-12-07 LAB — URINALYSIS, ROUTINE W REFLEX MICROSCOPIC
Bilirubin Urine: NEGATIVE
Glucose, UA: NEGATIVE mg/dL
Ketones, ur: NEGATIVE mg/dL
Nitrite: NEGATIVE
Protein, ur: NEGATIVE mg/dL
Specific Gravity, Urine: <1.005 — ABNORMAL LOW (ref 1.005–1.030)
pH: 6.5 (ref 5.0–8.0)

## 2015-12-07 LAB — URINE MICROSCOPIC-ADD ON

## 2015-12-07 LAB — POCT PREGNANCY, URINE: Preg Test, Ur: NEGATIVE

## 2015-12-07 NOTE — MAU Note (Signed)
Patient not in lobby

## 2015-12-07 NOTE — MAU Note (Signed)
Pt not in lobby x3 

## 2015-12-07 NOTE — MAU Note (Signed)
Patient has Kathleen Lloyd IUD having pain has cyst on right ovary, vaginal bleeding for 3 weeks changing a pad every hour, has had IUD since December.

## 2015-12-07 NOTE — MAU Note (Signed)
Not in lobby x2.

## 2015-12-07 NOTE — MAU Note (Signed)
Pt  Not in lobby x1

## 2016-01-31 ENCOUNTER — Emergency Department (HOSPITAL_COMMUNITY)
Admission: EM | Admit: 2016-01-31 | Discharge: 2016-02-01 | Disposition: A | Discharge status: Home / Self Care | Payer: Self-pay | Attending: Emergency Medicine | Admitting: Emergency Medicine

## 2016-01-31 DIAGNOSIS — R251 Tremor, unspecified: Secondary | ICD-10-CM | POA: Insufficient documentation

## 2016-01-31 DIAGNOSIS — Z8659 Personal history of other mental and behavioral disorders: Secondary | ICD-10-CM | POA: Insufficient documentation

## 2016-01-31 DIAGNOSIS — R079 Chest pain, unspecified: Secondary | ICD-10-CM | POA: Insufficient documentation

## 2016-01-31 DIAGNOSIS — Z87440 Personal history of urinary (tract) infections: Secondary | ICD-10-CM | POA: Insufficient documentation

## 2016-01-31 DIAGNOSIS — R000 Tachycardia, unspecified: Secondary | ICD-10-CM | POA: Insufficient documentation

## 2016-01-31 DIAGNOSIS — Z792 Long term (current) use of antibiotics: Secondary | ICD-10-CM | POA: Insufficient documentation

## 2016-01-31 NOTE — ED Provider Notes (Signed)
CSN: [id number]     Arrival date & time 01/31/16  2351 History   By signing my name below, I, Forrestine Him, attest that this documentation has been prepared under the direction and in the presence of Everlene Balls, MD.  Electronically Signed: Forrestine Him, ED Scribe. 01/31/2016. 12:35 AM.   Chief Complaint  Patient presents with  . Chest Pain   The history is provided by the patient. No language interpreter was used.    HPI Comments: Kathleen Lloyd brought in by EMS is a 20 y.o. female without any who presents to the Emergency Department complaining of constant, ongoing, worsening central chest pain x 1.5 hours. Pain is described as tightness. Discomfort is exacerbated with deep breathing without any alleviating factors. Per family, pt was found hyperventilating while in her bed after attending a graduation earlier this evening. Fiance states while en route to department, pts heart rate increased to the 160's. At that time, he states he noted a rash on her neck and says "her eyes were rolling in the back of her head". However, rash has now improved. No interventions given en route to department. She denies any recent fever, chills, nausea, vomiting, abdominal pain, or diarrhea. Pt denies any history of anxiety. No recent long distance travel. No family history of heart disease. Pt had her IUD removed last Monday and had a Depo injection the same day. No known allergies to medications.  PCP: Pcp Not In System    Past Medical History  Diagnosis Date  . UTI (lower urinary tract infection)   . Depression   . Vaginal delivery 05/2014   Past Surgical History  Procedure Laterality Date  . Mass excision Right 07/25/2014    Procedure: REMOVAL OF RIGHT AXILLA MASS;  Surgeon: Michael Boston, MD;  Location: [facility];  Service: General;  Laterality: Right;   Family History  Problem Relation Age of Onset  . Cancer Father     melanoma   Social History  Substance Use Topics  . Smoking status: Never Smoker    . Smokeless tobacco: Never Used  . Alcohol Use: Yes     Comment: occ   OB History    Gravida Para Term Preterm AB TAB SAB Ectopic Multiple Living   1 1 1       1      Review of Systems  Constitutional: Negative for fever and chills.  Respiratory: Negative for cough.   Cardiovascular: Positive for chest pain.  Gastrointestinal: Negative for nausea, vomiting, abdominal pain and diarrhea.  Neurological: Negative for headaches.  Psychiatric/Behavioral: Negative for confusion.  All other systems reviewed and are negative.     Allergies  Review of patient's allergies indicates no known allergies.  Home Medications   Prior to Admission medications   Medication Sig Start Date End Date Taking? Authorizing Provider  azithromycin (ZITHROMAX) 250 MG tablet Take 250 mg by mouth daily.    Historical Provider, MD  ibuprofen (ADVIL,MOTRIN) 400 MG tablet Take 400 mg by mouth every 6 (six) hours as needed for fever or moderate pain.    Historical Provider, MD  ibuprofen (ADVIL,MOTRIN) 800 MG tablet Take 1 tablet (800 mg total) by mouth every 8 (eight) hours as needed. 09/22/15   Lavetta Nielsen, CNM   Triage Vitals: BP 119/77 mmHg  Pulse 109  Temp(Src) 98.4 F (36.9 C) (Oral)  Resp 28  Ht 5\' 1"  (1.549 m)  Wt 160 lb (72.576 kg)  BMI 30.25 kg/m2  SpO2 100%   Physical Exam  Constitutional: She is oriented to person, place, and time. She appears well-developed and well-nourished. No distress.  Intermittent tremors noted   HENT:  Head: Normocephalic and atraumatic.  Nose: Nose normal.  Mouth/Throat: Oropharynx is clear and moist. No oropharyngeal exudate.  Eyes: Conjunctivae and EOM are normal. Pupils are equal, round, and reactive to light. No scleral icterus.  Neck: Normal range of motion. Neck supple. No JVD present. No tracheal deviation present. No thyromegaly present.  Cardiovascular: Regular rhythm and normal heart sounds.  Tachycardia present.  Exam reveals no gallop and no  friction rub.   No murmur heard. Pulmonary/Chest: Effort normal and breath sounds normal. No respiratory distress. She has no wheezes. She exhibits no tenderness.  Abdominal: Soft. Bowel sounds are normal. She exhibits no distension and no mass. There is no tenderness. There is no rebound and no guarding.  Musculoskeletal: Normal range of motion. She exhibits no edema or tenderness.  Lymphadenopathy:    She has no cervical adenopathy.  Neurological: She is alert and oriented to person, place, and time. No cranial nerve deficit. She exhibits normal muscle tone.  Skin: Skin is warm and dry. No rash noted. No erythema. No pallor.  Nursing note and vitals reviewed.   ED Course  Procedures (including critical care time)  DIAGNOSTIC STUDIES: Oxygen Saturation is 100% on RA, Normal by my interpretation.    COORDINATION OF CARE: 12:02 AM- Will give fluids. Will order imaging, EKG, and blood work. Discussed treatment plan with pt at bedside and pt agreed to plan.     Labs Review Labs Reviewed  BASIC METABOLIC PANEL - Abnormal; Notable for the following:    Potassium 3.1 (*)    CO2 17 (*)    Glucose, Bld 101 (*)    All other components within normal limits  CBC - Abnormal; Notable for the following:    Hemoglobin 11.5 (*)    HCT 35.7 (*)    All other components within normal limits  MAGNESIUM  I-STAT TROPOININ, ED    Imaging Review Dg Chest 2 View  02/01/2016  CLINICAL DATA:  20 year old female with mid chest pain EXAM: CHEST  2 VIEW COMPARISON:  None. FINDINGS: The heart size and mediastinal contours are within normal limits. Both lungs are clear. The visualized skeletal structures are unremarkable. IMPRESSION: No active cardiopulmonary disease. Electronically Signed   By: Anner Crete M.D.   On: 02/01/2016 00:49   I have personally reviewed and evaluated these images and lab results as part of my medical decision-making.   EKG Interpretation   Date/Time:  Thursday Jan 31 2016  23:59:15 EDT Ventricular Rate:  112 PR Interval:  146 QRS Duration: 90 QT Interval:  317 QTC Calculation: 433 R Axis:   40 Text Interpretation:  Sinus tachycardia Baseline wander in lead(s) V6  tachycardia now present Confirmed by Glynn Octave [phone]) on  02/01/2016 12:29:31 AM      MDM   Final diagnoses:  None   Patient presents to the ED for chest tightness of sudden onset. States she has no history of anxiety but this may be her first encouter.  No risk factors for PE.  History is not at all consistent with ACS.  Troponin drawn by triage and not clinically indicated, but is negative.  EKG only reveals sinus tachy.  She was given 2L IVF.  Potassium and magnesium are slightly low, will replace in the ED.  She was advised to see PCP within 3 days for close follow up.  Tachycardia has resolved.  VS remain within her normal limits. She appears well and in NAD. She is safe for DC.    I personally performed the services described in this documentation, which was scribed in my presence. The recorded information has been reviewed and is accurate.     Everlene Balls, MD 02/01/16 [phone]

## 2016-01-31 NOTE — ED Notes (Signed)
Per EMS, patient complains of tightness in chest. Found laying in bed hyperventilating. tachypnic on arrival of ems. No hx of anxiety, new stressors at home. bp 122/76, pulse range between 115-160s. Painful when deep breathing, o2 sats never dropped under 100% on room air.

## 2016-02-01 ENCOUNTER — Emergency Department (HOSPITAL_COMMUNITY): Payer: Self-pay

## 2016-02-01 ENCOUNTER — Encounter (HOSPITAL_COMMUNITY): Payer: Self-pay | Admitting: Emergency Medicine

## 2016-02-01 LAB — CBC
HCT: 35.7 % — ABNORMAL LOW (ref 36.0–46.0)
Hemoglobin: 11.5 g/dL — ABNORMAL LOW (ref 12.0–15.0)
MCH: 27.8 pg (ref 26.0–34.0)
MCHC: 32.2 g/dL (ref 30.0–36.0)
MCV: 86.2 fL (ref 78.0–100.0)
PLATELETS: 232 10*3/uL (ref 150–400)
RBC: 4.14 MIL/uL (ref 3.87–5.11)
RDW: 14.4 % (ref 11.5–15.5)
WBC: 7.3 10*3/uL (ref 4.0–10.5)

## 2016-02-01 LAB — BASIC METABOLIC PANEL
Anion gap: 14 (ref 5–15)
BUN: 11 mg/dL (ref 6–20)
CALCIUM: 9.3 mg/dL (ref 8.9–10.3)
CO2: 17 mmol/L — ABNORMAL LOW (ref 22–32)
CREATININE: 0.68 mg/dL (ref 0.44–1.00)
Chloride: 110 mmol/L (ref 101–111)
GFR calc Af Amer: >60 mL/min (ref >60)
Glucose, Bld: 101 mg/dL — ABNORMAL HIGH (ref 65–99)
Potassium: 3.1 mmol/L — ABNORMAL LOW (ref 3.5–5.1)
SODIUM: 141 mmol/L (ref 135–145)

## 2016-02-01 LAB — I-STAT TROPONIN, ED: Troponin i, poc: 0 ng/mL (ref 0.00–0.08)

## 2016-02-01 LAB — MAGNESIUM: MAGNESIUM: 1.7 mg/dL (ref 1.7–2.4)

## 2016-02-01 MED ORDER — SODIUM CHLORIDE 0.9 % IV BOLUS (SEPSIS)
1000.0000 mL | Freq: Once | INTRAVENOUS | Status: AC
  Administered 2016-02-01: 1000 mL via INTRAVENOUS

## 2016-02-01 MED ORDER — POTASSIUM CHLORIDE CRYS ER 20 MEQ PO TBCR
60.0000 meq | EXTENDED_RELEASE_TABLET | Freq: Once | ORAL | Status: AC
  Administered 2016-02-01: 60 meq via ORAL
  Filled 2016-02-01: qty 3

## 2016-02-01 MED ORDER — MAGNESIUM SULFATE 2 GM/50ML IV SOLN
2.0000 g | Freq: Once | INTRAVENOUS | Status: AC
  Administered 2016-02-01: 2 g via INTRAVENOUS
  Filled 2016-02-01: qty 50

## 2016-02-01 MED ORDER — POTASSIUM CHLORIDE 20 MEQ/15ML (10%) PO SOLN
60.0000 meq | Freq: Once | ORAL | Status: AC
  Administered 2016-02-01: 60 meq via ORAL
  Filled 2016-02-01: qty 45

## 2016-02-01 NOTE — Discharge Instructions (Signed)
Nonspecific Chest Pain Kathleen Lloyd, your electrolytes were replaced today.  This may have caused your symptoms tonight.  See a primary care doctor within 3 days for close follow up and to evaluate you for anxiety.  If any symptoms worsen, come back to the ED immediately. Thank you. It is often hard to find the cause of chest pain. There is always a chance that your pain could be related to something serious, such as a heart attack or a blood clot in your lungs. Chest pain can also be caused by conditions that are not life-threatening. If you have chest pain, it is very important to follow up with your doctor.  HOME CARE  If you were prescribed an antibiotic medicine, finish it all even if you start to feel better.  Avoid any activities that cause chest pain.  Do not use any tobacco products, including cigarettes, chewing tobacco, or electronic cigarettes. If you need help quitting, ask your doctor.  Do not drink alcohol.  Take medicines only as told by your doctor.  Keep all follow-up visits as told by your doctor. This is important. This includes any further testing if your chest pain does not go away.  Your doctor may tell you to keep your head raised (elevated) while you sleep.  Make lifestyle changes as told by your doctor. These may include:  Getting regular exercise. Ask your doctor to suggest some activities that are safe for you.  Eating a heart-healthy diet. Your doctor or a diet specialist (dietitian) can help you to learn healthy eating options.  Maintaining a healthy weight.  Managing diabetes, if necessary.  Reducing stress. GET HELP IF:  Your chest pain does not go away, even after treatment.  You have a rash with blisters on your chest.  You have a fever. GET HELP RIGHT AWAY IF:  Your chest pain is worse.  You have an increasing cough, or you cough up blood.  You have severe belly (abdominal) pain.  You feel extremely weak.  You pass out (faint).  You  have chills.  You have sudden, unexplained chest discomfort.  You have sudden, unexplained discomfort in your arms, back, neck, or jaw.  You have shortness of breath at any time.  You suddenly start to sweat, or your skin gets clammy.  You feel nauseous.  You vomit.  You suddenly feel light-headed or dizzy.  Your heart begins to beat quickly, or it feels like it is skipping beats. These symptoms may be an emergency. Do not wait to see if the symptoms will go away. Get medical help right away. Call your local emergency services (911 in the [place] Do not drive yourself to the hospital.   This information is not intended to replace advice given to you by your health care provider. Make sure you discuss any questions you have with your health care provider.   Document Released: 02/25/2008 Document Revised: 09/29/2014 Document Reviewed: 04/14/2014 Elsevier Interactive Patient Education [employer].

## 2016-02-01 NOTE — ED Notes (Signed)
Dr. Oni at the bedside.  

## 2016-02-06 ENCOUNTER — Observation Stay
Admission: EM | Admit: 2016-02-06 | Discharge: 2016-02-07 | Disposition: A | Discharge status: Home / Self Care | Payer: Managed Care, Other (non HMO) | Attending: Internal Medicine | Admitting: Internal Medicine

## 2016-02-06 ENCOUNTER — Emergency Department: Payer: Managed Care, Other (non HMO)

## 2016-02-06 DIAGNOSIS — E876 Hypokalemia: Secondary | ICD-10-CM | POA: Insufficient documentation

## 2016-02-06 DIAGNOSIS — R079 Chest pain, unspecified: Secondary | ICD-10-CM | POA: Diagnosis present

## 2016-02-06 DIAGNOSIS — I4891 Unspecified atrial fibrillation: Secondary | ICD-10-CM | POA: Insufficient documentation

## 2016-02-06 DIAGNOSIS — I340 Nonrheumatic mitral (valve) insufficiency: Secondary | ICD-10-CM | POA: Diagnosis not present

## 2016-02-06 DIAGNOSIS — M940 Chondrocostal junction syndrome [Tietze]: Secondary | ICD-10-CM | POA: Diagnosis not present

## 2016-02-06 DIAGNOSIS — Z808 Family history of malignant neoplasm of other organs or systems: Secondary | ICD-10-CM | POA: Insufficient documentation

## 2016-02-06 DIAGNOSIS — Z79899 Other long term (current) drug therapy: Secondary | ICD-10-CM | POA: Insufficient documentation

## 2016-02-06 DIAGNOSIS — R0789 Other chest pain: Principal | ICD-10-CM | POA: Insufficient documentation

## 2016-02-06 DIAGNOSIS — F329 Major depressive disorder, single episode, unspecified: Secondary | ICD-10-CM | POA: Diagnosis not present

## 2016-02-06 LAB — URINE DRUG SCREEN, QUALITATIVE (ARMC ONLY)
Amphetamines, Ur Screen: NOT DETECTED
BARBITURATES, UR SCREEN: NOT DETECTED
Benzodiazepine, Ur Scrn: NOT DETECTED
CANNABINOID 50 NG, UR ~~LOC~~: NOT DETECTED
COCAINE METABOLITE, UR ~~LOC~~: NOT DETECTED
MDMA (Ecstasy)Ur Screen: NOT DETECTED
Methadone Scn, Ur: NOT DETECTED
Opiate, Ur Screen: NOT DETECTED
PHENCYCLIDINE (PCP) UR S: NOT DETECTED
TRICYCLIC, UR SCREEN: NOT DETECTED

## 2016-02-06 LAB — BASIC METABOLIC PANEL
Anion gap: 9 (ref 5–15)
BUN: 13 mg/dL (ref 6–20)
CALCIUM: 9.3 mg/dL (ref 8.9–10.3)
CO2: 19 mmol/L — AB (ref 22–32)
CREATININE: 0.81 mg/dL (ref 0.44–1.00)
Chloride: 110 mmol/L (ref 101–111)
Glucose, Bld: 91 mg/dL (ref 65–99)
Potassium: 3.8 mmol/L (ref 3.5–5.1)
SODIUM: 138 mmol/L (ref 135–145)

## 2016-02-06 LAB — CBC
HCT: 38.5 % (ref 35.0–47.0)
Hemoglobin: 12.9 g/dL (ref 12.0–16.0)
MCH: 28.7 pg (ref 26.0–34.0)
MCHC: 33.4 g/dL (ref 32.0–36.0)
MCV: 85.7 fL (ref 80.0–100.0)
PLATELETS: 226 10*3/uL (ref 150–440)
RBC: 4.49 MIL/uL (ref 3.80–5.20)
RDW: 14.8 % — AB (ref 11.5–14.5)
WBC: 7.8 10*3/uL (ref 3.6–11.0)

## 2016-02-06 LAB — TROPONIN I: Troponin I: <0.03 ng/mL (ref <0.031)

## 2016-02-06 LAB — POCT PREGNANCY, URINE: PREG TEST UR: NEGATIVE

## 2016-02-06 MED ORDER — ACETAMINOPHEN 650 MG RE SUPP: 650.0000 mg | Freq: Four times a day (QID) | RECTAL | Status: DC | PRN

## 2016-02-06 MED ORDER — ENOXAPARIN SODIUM 40 MG/0.4ML ~~LOC~~ SOLN: 40.0000 mg | SUBCUTANEOUS | Status: DC

## 2016-02-06 MED ORDER — LORAZEPAM 2 MG PO TABS
2.0000 mg | ORAL_TABLET | Freq: Once | ORAL | Status: AC
  Administered 2016-02-06: 2 mg via ORAL
  Filled 2016-02-06: qty 1

## 2016-02-06 MED ORDER — ONDANSETRON HCL 4 MG/2ML IJ SOLN: 4.0000 mg | Freq: Four times a day (QID) | INTRAMUSCULAR | Status: DC | PRN

## 2016-02-06 MED ORDER — LORAZEPAM 2 MG/ML IJ SOLN
1.0000 mg | Freq: Once | INTRAMUSCULAR | Status: AC
  Administered 2016-02-06: 1 mg via INTRAVENOUS
  Filled 2016-02-06: qty 1

## 2016-02-06 MED ORDER — IOPAMIDOL (ISOVUE-370) INJECTION 76%: 75.0000 mL | Freq: Once | INTRAVENOUS | Status: DC | PRN

## 2016-02-06 MED ORDER — IOPAMIDOL (ISOVUE-300) INJECTION 61%
75.0000 mL | Freq: Once | INTRAVENOUS | Status: AC | PRN
Start: 2016-02-06 — End: 2016-02-06
  Administered 2016-02-06: 75 mL via INTRAVENOUS

## 2016-02-06 MED ORDER — MIDAZOLAM HCL 2 MG/2ML IJ SOLN
1.0000 mg | Freq: Once | INTRAMUSCULAR | Status: AC
  Administered 2016-02-06: 1 mg via INTRAVENOUS
  Filled 2016-02-06: qty 2

## 2016-02-06 MED ORDER — ACETAMINOPHEN 325 MG PO TABS: 650.0000 mg | ORAL_TABLET | Freq: Four times a day (QID) | ORAL | Status: DC | PRN

## 2016-02-06 MED ORDER — HYDROCODONE-ACETAMINOPHEN 5-325 MG PO TABS
1.0000 | ORAL_TABLET | ORAL | Status: DC | PRN
Start: 2016-02-06 — End: 2016-02-07
  Administered 2016-02-07: 1 via ORAL
  Filled 2016-02-06: qty 1

## 2016-02-06 MED ORDER — SODIUM CHLORIDE 0.9% FLUSH
3.0000 mL | Freq: Two times a day (BID) | INTRAVENOUS | Status: DC
  Administered 2016-02-07 (×2): 3 mL via INTRAVENOUS

## 2016-02-06 MED ORDER — ONDANSETRON HCL 4 MG PO TABS
4.0000 mg | ORAL_TABLET | Freq: Four times a day (QID) | ORAL | Status: DC | PRN
  Administered 2016-02-07: 4 mg via ORAL
  Filled 2016-02-06: qty 1

## 2016-02-06 NOTE — ED Notes (Addendum)
Pt bib EMS w/ c/o CP.  Per EMS, pt had similar attack last week.  Pt sts that last week she went to [facility], and had electrolyte imbalance.  Pt c/o 10/10 tighness in center of chest w/ no radiation.  Pt unable to talk in complete sentences, stopping between words.  Pt appears anxious and tearful in triage.  Per EMS, pt given 324 ASA and 1 SL NTG w/ no relief .  Denies n/v/d, dizziness or LOC.

## 2016-02-06 NOTE — ED Notes (Signed)
Pt called out complaining of chest tightness; upon assessment, pt with difficulty speaking, arm tremors, pt friend reports pt having chest tightness again.  MD notified and to bedside for re-assessment.

## 2016-02-06 NOTE — ED Provider Notes (Signed)
Time Seen: Approximately 2100 I have reviewed the triage notes  Chief Complaint: Chest Pain   History of Present Illness: Kathleen Lloyd is a 20 y.o. female who presents with an episode of chest discomfort. Patient was recently evaluated at outside emergency department extensive evaluation which does not show any obvious etiology for her chest pain. Patient also describes what sounds like some near syncopal symptoms. Patient describes some chest tightness in the middle of her chest. She is trembling and tearful with signs and symptoms consistent with anxiety. Patient received an aspirin and sublingual nitroglycerin prior to arrival without any relief. She denies any nausea vomiting or feelings of lightheadedness at present. She has a history of depression but is not suicidal, homicidal or having hallucinations.  Past Medical History  Diagnosis Date  . UTI (lower urinary tract infection)   . Depression   . Vaginal delivery 05/2014    Patient Active Problem List   Diagnosis Date Noted  . Pelvic pain in female 09/22/2015  . Vaginal delivery 06/11/2014  . Cellulitis of axilla, right 03/15/2014    Past Surgical History  Procedure Laterality Date  . Mass excision Right 07/25/2014    Procedure: REMOVAL OF RIGHT AXILLA MASS;  Surgeon: Michael Boston, MD;  Location: [facility];  Service: General;  Laterality: Right;    Past Surgical History  Procedure Laterality Date  . Mass excision Right 07/25/2014    Procedure: REMOVAL OF RIGHT AXILLA MASS;  Surgeon: Michael Boston, MD;  Location: [facility];  Service: General;  Laterality: Right;    Current Outpatient Rx  Name  Route  Sig  Dispense  Refill  . ibuprofen (ADVIL,MOTRIN) 400 MG tablet   Oral   Take 400 mg by mouth every 6 (six) hours as needed for fever or moderate pain.         . medroxyPROGESTERone (DEPO-PROVERA) 150 MG/ML injection   Intramuscular   Inject 150 mg into the muscle every 3 (three) months.           Allergies:  Review of  patient's allergies indicates no known allergies.  Family History: Family History  Problem Relation Age of Onset  . Cancer Father     melanoma    Social History: Social History  Substance Use Topics  . Smoking status: Never Smoker   . Smokeless tobacco: Never Used  . Alcohol Use: Yes     Comment: occ     Review of Systems:   10 point review of systems was performed and was otherwise negative:  Constitutional: No fever Eyes: No visual disturbances ENT: No sore throat, ear pain Cardiac: Chest pain as substernal without much in way of radiation to the back or sides. Respiratory: No shortness of breath, wheezing, or stridor Abdomen: No abdominal pain, no vomiting, No diarrhea Endocrine: No weight loss, No night sweats Extremities: No peripheral edema, cyanosis Skin: No rashes, easy bruising Neurologic: No focal weakness, trouble with speech or swollowing Urologic: No dysuria, Hematuria, or urinary frequency   Physical Exam:  ED Triage Vitals  Enc Vitals Group     BP 02/06/16 1629 147/92 mmHg     Pulse Rate 02/06/16 1629 108     Resp 02/06/16 1629 23     Temp 02/06/16 1629 98.9 F (37.2 C)     Temp Source 02/06/16 1629 Oral     SpO2 02/06/16 1629 100 %     Weight 02/06/16 1629 160 lb (72.576 kg)     Height 02/06/16 1629 5\' 1"  (  1.549 m)     Head Cir --      Peak Flow --      Pain Score 02/06/16 1625 10     Pain Loc --      Pain Edu? --      Excl. in [place]? --     General: Awake , Alert , and Oriented times 3; GCS 15 Very anxious Head: Normal cephalic , atraumatic Eyes: Pupils equal , round, reactive to light Nose/Throat: No nasal drainage, patent upper airway without erythema or exudate.  Neck: Supple, Full range of motion, No anterior adenopathy or palpable thyroid masses Lungs: Clear to ascultation without wheezes , rhonchi, or rales Heart: Regular rate, regular rhythm without murmurs , gallops , or rubs Abdomen: Soft, non tender without rebound, guarding , or  rigidity; bowel sounds positive and symmetric in all 4 quadrants. No organomegaly .        Extremities: 2 plus symmetric pulses. No edema, clubbing or cyanosis Neurologic: normal ambulation, Motor symmetric without deficits, sensory intact Skin: warm, dry, no rashes   Labs:   All laboratory work was reviewed including any pertinent negatives or positives listed below:  Labs Reviewed  [place] - Abnormal; Notable for the following:    CO2 19 (*)    All other components within normal limits  CBC - Abnormal; Notable for the following:    RDW 14.8 (*)    All other components within normal limits  TROPONIN I  URINE DRUG SCREEN, QUALITATIVE ([facility] ONLY)  POC URINE PREG, ED  POCT PREGNANCY, URINE  Laboratory work was reviewed and showed no clinically significant abnormalities.   EKG:  ED ECG REPORT I, Daymon Larsen, the attending physician, personally viewed and interpreted this ECG.  Date: 02/06/2016 EKG Time: 1627 Rate: 101 Rhythm: Sinus tachycardia QRS Axis: normal Intervals: normal ST/T Wave abnormalities: normal Conduction Disturbances: none Narrative Interpretation: unremarkable CLINICAL DATA: 20 year old female with chest pain.  EXAM: CT ANGIOGRAPHY CHEST WITH CONTRAST  TECHNIQUE: Multidetector CT imaging of the chest was performed using the standard protocol during bolus administration of intravenous contrast. Multiplanar CT image reconstructions and MIPs were obtained to evaluate the vascular anatomy.  CONTRAST: 2mL ISOVUE-300 IOPAMIDOL (ISOVUE-300) INJECTION 61%  COMPARISON: Chest radiograph dated 02/01/2016  FINDINGS: Evaluation of this exam is limited due to respiratory motion artifact.  The lungs are clear. There is no pleural effusion or pneumothorax. The central airways are patent.  The thoracic aorta and the origins of the great vessels of the aortic arch appear patent. Set new evaluation of the pulmonary arteries is limited due to  suboptimal visualization of the distal branches secondary to respiratory motion artifact. Go no central pulmonary artery embolus identified. There is no cardiomegaly or pericardial effusion. No hilar or mediastinal adenopathy. Residual thymic tissue noted in the anterior mediastinum. The esophagus is grossly unremarkable.  There is no axillary adenopathy. The chest wall soft tissues appear unremarkable. The osseous structures are intact.  Review of the MIP images confirms the above findings.  IMPRESSION: No CT evidence of central pulmonary artery embolus.   Electronically Signed By: Anner Crete M.D. On: 02/06/2016 18:28       Otherwise normal EKG  Radiology:      I personally reviewed the radiologic studies    ED Course:  Patient was worked up for life-threatening causes for chest pain. She has a normal exam and normal EKG. Troponin is negative on today's evaluation. The patient's afebrile and does not appear to have  any signs of pneumonia, pulmonary embolism, or aortic dissection per chest CT evaluation. Patient continues to exhibit signs of anxiety and was initially given an IV Ativan and then by mouth Ativan without any resolution of her chest pain. She now toward the end of her stay started developing some tonic contractions of both upper extremities. This does not appear to be with manic such as a seizure. The patient has periodic pauses and was continued to be awake and is conversive. I offered a head CT but it is a significant exposure of radiation having already received a chest CT earlier. The patient due to her lack of improvement and the patient and her family and friends are convinced that this is either cardiac in nature or epilepsy. I've advised her and her family members this most likely is anxiety but they are not convinced this is the appropriate diagnosis for the inpatient observation and consultation. Patient's case was reviewed with the hospitalist  team   Assessment: * Acute unspecified chest pain Possible pseudoseizures  Final Clinical Impression:   Final diagnoses:  Chest pain     Plan:  Inpatient management            Daymon Larsen, MD 02/06/16 2053

## 2016-02-06 NOTE — H&P (Signed)
[place] [facility] [place] NAME: Kathleen Lloyd    MR#:  [id number]  DATE OF BIRTH:  1996-01-21  DATE OF ADMISSION:  02/06/2016  PRIMARY CARE PHYSICIAN: Pcp Not In System   REQUESTING/REFERRING PHYSICIAN: Marcelene Butte, MD  CHIEF COMPLAINT:   Chief Complaint  Patient presents with  . Chest Pain    HISTORY OF PRESENT ILLNESS:  Kathleen Lloyd  is a 20 y.o. female who presents with Chest pain. Patient states that this episode started around 5:00 this afternoon and has been constant since that time. The pain is central, sharp and "twisting." It is associated with some shortness of breath, and is aggravated by deep breathing. She has similar episode happen 5 days ago. She was evaluated at [facility] in [place] at that time. Workup in the ED there did not show any significant abnormality. Initial workup here today also does not show any significant abnormality. Hospitalists were called for admission for further evaluation.  PAST MEDICAL HISTORY:   Past Medical History  Diagnosis Date  . UTI (lower urinary tract infection)   . Depression   . Vaginal delivery 05/2014    PAST SURGICAL HISTORY:   Past Surgical History  Procedure Laterality Date  . Mass excision Right 07/25/2014    Procedure: REMOVAL OF RIGHT AXILLA MASS;  Surgeon: Michael Boston, MD;  Location: [facility];  Service: General;  Laterality: Right;    SOCIAL HISTORY:   Social History  Substance Use Topics  . Smoking status: Never Smoker   . Smokeless tobacco: Never Used  . Alcohol Use: Yes     Comment: occ    FAMILY HISTORY:   Family History  Problem Relation Age of Onset  . Cancer Father     melanoma    DRUG ALLERGIES:  No Known Allergies  MEDICATIONS AT HOME:   Prior to Admission medications   Medication Sig Start Date End Date Taking? Authorizing Provider  ibuprofen (ADVIL,MOTRIN) 400 MG tablet Take 400 mg by mouth every 6 (six) hours as needed for fever or  moderate pain.    Historical Provider, MD  medroxyPROGESTERone (DEPO-PROVERA) 150 MG/ML injection Inject 150 mg into the muscle every 3 (three) months.    Historical Provider, MD    REVIEW OF SYSTEMS:  Review of Systems  Constitutional: Negative for fever, chills, weight loss and malaise/fatigue.  HENT: Negative for ear pain, hearing loss and tinnitus.   Eyes: Negative for blurred vision, double vision, pain and redness.  Respiratory: Positive for shortness of breath. Negative for cough and hemoptysis.   Cardiovascular: Positive for chest pain. Negative for palpitations, orthopnea and leg swelling.  Gastrointestinal: Negative for nausea, vomiting, abdominal pain, diarrhea and constipation.  Genitourinary: Negative for dysuria, frequency and hematuria.  Musculoskeletal: Negative for back pain, joint pain and neck pain.  Skin:       No acne, rash, or lesions  Neurological: Negative for dizziness, tremors, focal weakness and weakness.  Endo/Heme/Allergies: Negative for polydipsia. Does not bruise/bleed easily.  Psychiatric/Behavioral: Negative for depression. The patient is not nervous/anxious and does not have insomnia.      VITAL SIGNS:   Filed Vitals:   02/06/16 1700 02/06/16 1730 02/06/16 1930 02/06/16 2010  BP: 118/71 110/71 109/52 111/72  Pulse: 96 98 97 93  Temp:      TempSrc:      Resp: 24 16 22 17   Height:      Weight:      SpO2: 100% 100% 99%  98%   Wt Readings from Last 3 Encounters:  02/06/16 72.576 kg (160 lb)  01/31/16 72.576 kg (160 lb)  12/07/15 73.029 kg (161 lb)    PHYSICAL EXAMINATION:  Physical Exam  Vitals reviewed. Constitutional: She is oriented to person, place, and time. She appears well-developed and well-nourished. No distress.  HENT:  Head: Normocephalic and atraumatic.  Mouth/Throat: Oropharynx is clear and moist.  Eyes: Conjunctivae and EOM are normal. Pupils are equal, round, and reactive to light. No scleral icterus.  Neck: Normal range of  motion. Neck supple. No JVD present. No thyromegaly present.  Cardiovascular: Regular rhythm and intact distal pulses.  Exam reveals no gallop and no friction rub.   No murmur heard. Borderline tachycardic  Respiratory: Effort normal and breath sounds normal. No respiratory distress. She has no wheezes. She has no rales.  GI: Soft. Bowel sounds are normal. She exhibits no distension. There is no tenderness.  Musculoskeletal: Normal range of motion. She exhibits no edema.  No arthritis, no gout  Lymphadenopathy:    She has no cervical adenopathy.  Neurological: She is alert and oriented to person, place, and time. No cranial nerve deficit.  No dysarthria, no aphasia  Skin: Skin is warm and dry. No rash noted. No erythema.  Psychiatric: She has a normal mood and affect. Her behavior is normal. Judgment and thought content normal.    LABORATORY PANEL:   CBC  Recent Labs Lab 02/06/16 1619  WBC 7.8  HGB 12.9  HCT 38.5  PLT 226   ------------------------------------------------------------------------------------------------------------------  Chemistries   Recent Labs Lab 02/01/16 0030 02/06/16 1619  NA 141 138  K 3.1* 3.8  CL 110 110  CO2 17* 19*  GLUCOSE 101* 91  BUN 11 13  CREATININE 0.68 0.81  CALCIUM 9.3 9.3  MG 1.7  --    ------------------------------------------------------------------------------------------------------------------  Cardiac Enzymes  Recent Labs Lab 02/06/16 1619  TROPONINI <0.03   ------------------------------------------------------------------------------------------------------------------  RADIOLOGY:  Ct Angio Chest Pe W/cm &/or Wo Cm  02/06/2016  CLINICAL DATA:  20 year old female with chest pain. EXAM: CT ANGIOGRAPHY CHEST WITH CONTRAST TECHNIQUE: Multidetector CT imaging of the chest was performed using the standard protocol during bolus administration of intravenous contrast. Multiplanar CT image reconstructions and MIPs were  obtained to evaluate the vascular anatomy. CONTRAST:  38mL ISOVUE-300 IOPAMIDOL (ISOVUE-300) INJECTION 61% COMPARISON:  Chest radiograph dated 02/01/2016 FINDINGS: Evaluation of this exam is limited due to respiratory motion artifact. The lungs are clear. There is no pleural effusion or pneumothorax. The central airways are patent. The thoracic aorta and the origins of the great vessels of the aortic arch appear patent. Set new evaluation of the pulmonary arteries is limited due to suboptimal visualization of the distal branches secondary to respiratory motion artifact. Go no central pulmonary artery embolus identified. There is no cardiomegaly or pericardial effusion. No hilar or mediastinal adenopathy. Residual thymic tissue noted in the anterior mediastinum. The esophagus is grossly unremarkable. There is no axillary adenopathy. The chest wall soft tissues appear unremarkable. The osseous structures are intact. Review of the MIP images confirms the above findings. IMPRESSION: No CT evidence of central pulmonary artery embolus. Electronically Signed   By: Anner Crete M.D.   On: 02/06/2016 18:28    EKG:   Orders placed or performed during the hospital encounter of 02/06/16  . ED EKG within 10 minutes  . ED EKG within 10 minutes  . EKG 12-Lead  . EKG 12-Lead  . ED EKG  . ED EKG  IMPRESSION AND PLAN:  Principal Problem:   Chest pain - admit for observation to telemetry floor, cycle cardiac enzymes, get an echocardiogram and a cardiology consult in the morning.  All the records are reviewed and case discussed with ED provider. Management plans discussed with the patient and/or family.  DVT PROPHYLAXIS: SubQ lovenox  GI PROPHYLAXIS: None  ADMISSION STATUS: Observation  CODE STATUS: Full Code Status History    Date Active Date Inactive Code Status Order ID Comments User Context   06/11/2014  4:59 PM 06/13/2014  2:25 PM Full Code [id number]  Donnel Saxon, CNM Inpatient   06/11/2014  3:49  AM 06/11/2014  4:59 PM Full Code [id number]  Gavin Pound, CNM Inpatient      TOTAL TIME TAKING CARE OF THIS PATIENT: 40 minutes.    Juanitta Earnhardt [place] 02/06/2016, 9:25 PM  [employer] [facility]  Office  [phone]  CC: Primary care physician; Pcp Not In System

## 2016-02-06 NOTE — ED Notes (Signed)
Patient transported to CT 

## 2016-02-06 NOTE — ED Notes (Signed)
Pt was transferred to room 237

## 2016-02-07 ENCOUNTER — Observation Stay (HOSPITAL_BASED_OUTPATIENT_CLINIC_OR_DEPARTMENT_OTHER)
Admit: 2016-02-07 | Discharge: 2016-02-07 | Disposition: A | Discharge status: Home / Self Care | Payer: Managed Care, Other (non HMO) | Attending: Internal Medicine | Admitting: Internal Medicine

## 2016-02-07 DIAGNOSIS — R000 Tachycardia, unspecified: Secondary | ICD-10-CM | POA: Diagnosis not present

## 2016-02-07 DIAGNOSIS — R0789 Other chest pain: Secondary | ICD-10-CM | POA: Diagnosis not present

## 2016-02-07 DIAGNOSIS — R079 Chest pain, unspecified: Secondary | ICD-10-CM

## 2016-02-07 LAB — BASIC METABOLIC PANEL
Anion gap: 7 (ref 5–15)
BUN: 13 mg/dL (ref 6–20)
CALCIUM: 8.9 mg/dL (ref 8.9–10.3)
CO2: 22 mmol/L (ref 22–32)
CREATININE: 0.65 mg/dL (ref 0.44–1.00)
Chloride: 111 mmol/L (ref 101–111)
GFR calc non Af Amer: >60 mL/min (ref >60)
Glucose, Bld: 91 mg/dL (ref 65–99)
Potassium: 3.5 mmol/L (ref 3.5–5.1)
SODIUM: 140 mmol/L (ref 135–145)

## 2016-02-07 LAB — ECHOCARDIOGRAM COMPLETE
Height: 61 in
WEIGHTICAEL: 2561.6 [oz_av]

## 2016-02-07 LAB — CBC
HCT: 36.2 % (ref 35.0–47.0)
HEMATOCRIT: 35.4 % (ref 35.0–47.0)
Hemoglobin: 11.8 g/dL — ABNORMAL LOW (ref 12.0–16.0)
Hemoglobin: 12 g/dL (ref 12.0–16.0)
MCH: 28.3 pg (ref 26.0–34.0)
MCH: 28.5 pg (ref 26.0–34.0)
MCHC: 33.2 g/dL (ref 32.0–36.0)
MCHC: 33.3 g/dL (ref 32.0–36.0)
MCV: 85.2 fL (ref 80.0–100.0)
MCV: 85.5 fL (ref 80.0–100.0)
PLATELETS: 179 10*3/uL (ref 150–440)
PLATELETS: 193 10*3/uL (ref 150–440)
RBC: 4.16 MIL/uL (ref 3.80–5.20)
RBC: 4.23 MIL/uL (ref 3.80–5.20)
RDW: 15.1 % — AB (ref 11.5–14.5)
RDW: 15.2 % — AB (ref 11.5–14.5)
WBC: 5.5 10*3/uL (ref 3.6–11.0)
WBC: 6.7 10*3/uL (ref 3.6–11.0)

## 2016-02-07 LAB — CREATININE, SERUM: CREATININE: 0.66 mg/dL (ref 0.44–1.00)

## 2016-02-07 LAB — MAGNESIUM: Magnesium: 1.9 mg/dL (ref 1.7–2.4)

## 2016-02-07 LAB — TROPONIN I

## 2016-02-07 LAB — TSH: TSH: 0.913 u[IU]/mL (ref 0.350–4.500)

## 2016-02-07 MED ORDER — POTASSIUM CHLORIDE CRYS ER 20 MEQ PO TBCR: 40.0000 meq | EXTENDED_RELEASE_TABLET | Freq: Once | ORAL | Status: DC

## 2016-02-07 MED ORDER — LORAZEPAM 1 MG PO TABS
2.0000 mg | ORAL_TABLET | Freq: Once | ORAL | Status: AC
  Administered 2016-02-07: 2 mg via ORAL
  Filled 2016-02-07: qty 2

## 2016-02-07 MED ORDER — METHYLPREDNISOLONE SODIUM SUCC 125 MG IJ SOLR
60.0000 mg | Freq: Four times a day (QID) | INTRAMUSCULAR | Status: DC
  Administered 2016-02-07: 60 mg via INTRAVENOUS
  Filled 2016-02-07: qty 2

## 2016-02-07 MED ORDER — POTASSIUM CHLORIDE ER 10 MEQ PO TBCR: 10.0000 meq | EXTENDED_RELEASE_TABLET | Freq: Every day | ORAL | Status: DC

## 2016-02-07 MED ORDER — HYDROCODONE-ACETAMINOPHEN 5-325 MG PO TABS: 1.0000 | ORAL_TABLET | Freq: Four times a day (QID) | ORAL | Status: DC | PRN

## 2016-02-07 MED ORDER — PREDNISONE 5 MG PO TABS: ORAL_TABLET | ORAL | Status: DC

## 2016-02-07 MED ORDER — POTASSIUM CHLORIDE CRYS ER 20 MEQ PO TBCR
40.0000 meq | EXTENDED_RELEASE_TABLET | Freq: Once | ORAL | Status: AC
  Administered 2016-02-07: 40 meq via ORAL
  Filled 2016-02-07: qty 2

## 2016-02-07 NOTE — Care Management (Signed)
Patient placed in observation for chest pain.  Work up thus far is negative.  At present, do not anticipate any discharge needs.

## 2016-02-07 NOTE — Discharge Instructions (Signed)
Chest Wall Pain °Chest wall pain is pain in or around the bones and muscles of your chest. Sometimes, an injury causes this pain. Sometimes, the cause may not be known. This pain may take several weeks or longer to get better. °HOME CARE °Pay attention to any changes in your symptoms. Take these actions to help with your pain: °· Rest as told by your doctor. °· Avoid activities that cause pain. Try not to use your chest, belly (abdominal), or side muscles to lift heavy things. °· If directed, apply ice to the painful area: °¨ Put ice in a plastic bag. °¨ Place a towel between your skin and the bag. °¨ Leave the ice on for 20 minutes, 2-3 times per day. °· Take over-the-counter and prescription medicines only as told by your doctor. °· Do not use tobacco products, including cigarettes, chewing tobacco, and e-cigarettes. If you need help quitting, ask your doctor. °· Keep all follow-up visits as told by your doctor. This is important. °GET HELP IF: °· You have a fever. °· Your chest pain gets worse. °· You have new symptoms. °GET HELP RIGHT AWAY IF: °· You feel sick to your stomach (nauseous) or you throw up (vomit). °· You feel sweaty or light-headed. °· You have a cough with phlegm (sputum) or you cough up blood. °· You are short of breath. °  °This information is not intended to replace advice given to you by your health care provider. Make sure you discuss any questions you have with your health care provider. °  °Document Released: 02/25/2008 Document Revised: 05/30/2015 Document Reviewed: 12/04/2014 °Elsevier Interactive Patient Education ©2016 Elsevier Inc. ° °

## 2016-02-07 NOTE — Progress Notes (Signed)
Patient discharged to home as ordered, patient has a follow up appointment with Dr. Rogue Jury 02-20-2016 at 200pm. Prescription given to patient as ordered. Patient is A/O, denies pain at this time, no acute distress noted. IV discontinued to right antecubital site clean and dry.

## 2016-02-07 NOTE — Progress Notes (Signed)
Patient ID: Kathleen Lloyd, female   DOB: [birth year], 20 y.o.   MRN: [id number] [place] [facility] [place] was admitted to the Hospital on 02/06/2016 and Discharged  02/07/2016 and should be excused from work/school   for 4 days starting 02/06/2016 , may return to work/school without any restrictions.  Loletha Grayer M.D on 02/07/2016,at 2:12 PM  [place] [facility] [place]

## 2016-02-07 NOTE — Progress Notes (Signed)
Pt in room.  Requested to see nurse.  Upon entering, patient with intermittent shivering-like tremors.  Denies being cool.  Complains of chest pressure.  Requesting pain meds.  Dr Marcille Blanco notified of medication request and episode of tremors.  MD on floor to evaluate.

## 2016-02-07 NOTE — Consult Note (Signed)
Cardiology Consultation Note  Patient ID: Kathleen Lloyd, MRN: [id number], DOB/AGE: May 21, 1996 19 y.o. Admit date: 02/06/2016   Date of Consult: 02/07/2016 Primary Physician: Pcp Not In System Primary Cardiologist: New to [facility] Requesting Physician: D. Jannifer Franklin, MD  Chief Complaint: Pleuritic chest pain Reason for Consult: Chest pain  HPI: 20 y.o. female with h/o depression, but no known previous cardiac history presented to [facility] on 5/17 with pleuritic chest pain that was also reproducible to touch. Cardiology is consulted to evaluate her symptoms.   She was recently seen in the [facility] ED on 5/11 for pleuritic chest pain that was also reproducible to palpation. Workup there was 100% unremarkable. She was advised she has anxiety and told to follow up with her PCP. She presented to the [facility] ED on 5/17 after developing pleuritic chest that is reproducible to touch. Per patient her symptoms this time are much worse. Pain is described as being central, nonradiating, worse with deep inspiration, and reproducible to touch. There have also been some palpitations. She denies any nausea, vomiting, diaphoresis, presyncope, or syncope. She denies any tobacco abuse, etoh abuse, or illegal drugs. Secondly, she notes orthostasis.    Upon the patient's arrival to [facility] they were found to have troponin negative x 3, negative CTA chest for PE and aortic dissection, unremarkable bmet and cbc. ECG as below. She reports continued pain at this time while resting comfortably in bed.    Past Medical History  Diagnosis Date  . UTI (lower urinary tract infection)   . Depression   . Vaginal delivery 05/2014      Most Recent Cardiac Studies: Echo pending   Surgical History:  Past Surgical History  Procedure Laterality Date  . Mass excision Right 07/25/2014    Procedure: REMOVAL OF RIGHT AXILLA MASS;  Surgeon: Michael Boston, MD;  Location: [facility];  Service: General;  Laterality: Right;     Home Meds: Prior to Admission  medications   Medication Sig Start Date End Date Taking? Authorizing Provider  ibuprofen (ADVIL,MOTRIN) 400 MG tablet Take 400 mg by mouth every 6 (six) hours as needed for fever or moderate pain.   Yes Historical Provider, MD  medroxyPROGESTERone (DEPO-PROVERA) 150 MG/ML injection Inject 150 mg into the muscle every 3 (three) months.   Yes Historical Provider, MD    Inpatient Medications:  . enoxaparin (LOVENOX) injection  40 mg Subcutaneous Q24H  . sodium chloride flush  3 mL Intravenous Q12H      Allergies: No Known Allergies  Social History   Social History  . Marital Status: Single    Spouse Name: N/A  . Number of Children: N/A  . Years of Education: N/A   Occupational History  . Not on file.   Social History Main Topics  . Smoking status: Never Smoker   . Smokeless tobacco: Never Used  . Alcohol Use: Yes     Comment: occ  . Drug Use: No  . Sexual Activity: Yes    Birth Control/ Protection: Injection   Other Topics Concern  . Not on file   Social History Narrative     Family History  Problem Relation Age of Onset  . Cancer Father     melanoma     Review of Systems: Review of Systems  Constitutional: Positive for malaise/fatigue. Negative for fever, chills, weight loss and diaphoresis.  HENT: Negative for congestion and sore throat.   Eyes: Negative for discharge and redness.  Respiratory: Positive for shortness of breath. Negative for cough,  hemoptysis, sputum production and wheezing.   Cardiovascular: Positive for chest pain and palpitations. Negative for orthopnea, claudication, leg swelling and PND.  Gastrointestinal: Negative for heartburn, nausea, vomiting, abdominal pain, diarrhea and constipation.  Musculoskeletal: Negative for myalgias, back pain, joint pain, falls and neck pain.  Skin: Negative for rash.  Neurological: Positive for dizziness and weakness. Negative for tingling, tremors, sensory change, speech change, focal weakness, seizures, loss  of consciousness and headaches.  Endo/Heme/Allergies: Positive for environmental allergies. Does not bruise/bleed easily.  Psychiatric/Behavioral: Negative for depression, suicidal ideas, hallucinations, memory loss and substance abuse. The patient is nervous/anxious. The patient does not have insomnia.   All other systems reviewed and are negative.   Labs:  Recent Labs  02/06/16 1619 02/06/16 2358 02/07/16 0530  TROPONINI <0.03 <0.03 <0.03   Lab Results  Component Value Date   WBC 5.5 02/07/2016   HGB 12.0 02/07/2016   HCT 36.2 02/07/2016   MCV 85.5 02/07/2016   PLT 179 02/07/2016    Recent Labs Lab 02/07/16 0530  NA 140  K 3.5  CL 111  CO2 22  BUN 13  CREATININE 0.65  CALCIUM 8.9  GLUCOSE 91   No results found for: CHOL, HDL, LDLCALC, TRIG No results found for: DDIMER  Radiology/Studies:  Dg Chest 2 View  02/01/2016  CLINICAL DATA:  20 year old female with mid chest pain EXAM: CHEST  2 VIEW COMPARISON:  None. FINDINGS: The heart size and mediastinal contours are within normal limits. Both lungs are clear. The visualized skeletal structures are unremarkable. IMPRESSION: No active cardiopulmonary disease. Electronically Signed   By: Anner Crete M.D.   On: 02/01/2016 00:49   Ct Angio Chest Pe W/cm &/or Wo Cm  02/06/2016  CLINICAL DATA:  20 year old female with chest pain. EXAM: CT ANGIOGRAPHY CHEST WITH CONTRAST TECHNIQUE: Multidetector CT imaging of the chest was performed using the standard protocol during bolus administration of intravenous contrast. Multiplanar CT image reconstructions and MIPs were obtained to evaluate the vascular anatomy. CONTRAST:  20mL ISOVUE-300 IOPAMIDOL (ISOVUE-300) INJECTION 61% COMPARISON:  Chest radiograph dated 02/01/2016 FINDINGS: Evaluation of this exam is limited due to respiratory motion artifact. The lungs are clear. There is no pleural effusion or pneumothorax. The central airways are patent. The thoracic aorta and the origins of  the great vessels of the aortic arch appear patent. Set new evaluation of the pulmonary arteries is limited due to suboptimal visualization of the distal branches secondary to respiratory motion artifact. Go no central pulmonary artery embolus identified. There is no cardiomegaly or pericardial effusion. No hilar or mediastinal adenopathy. Residual thymic tissue noted in the anterior mediastinum. The esophagus is grossly unremarkable. There is no axillary adenopathy. The chest wall soft tissues appear unremarkable. The osseous structures are intact. Review of the MIP images confirms the above findings. IMPRESSION: No CT evidence of central pulmonary artery embolus. Electronically Signed   By: Anner Crete M.D.   On: 02/06/2016 18:28    EKG: sinus tachycardia, 101 bpm, baseline wandering V3, lateral Q waves (not present on EKG from 01/31/2016)  Weights: Filed Weights   02/06/16 1629 02/06/16 2338  Weight: 160 lb (72.576 kg) 160 lb 1.6 oz (72.621 kg)     Physical Exam: Blood pressure 100/59, pulse 76, temperature 98 F (36.7 C), temperature source Oral, resp. rate 16, height 5\' 1"  (1.549 m), weight 160 lb 1.6 oz (72.621 kg), SpO2 99 %, not currently breastfeeding. Body mass index is 30.27 kg/(m^2). General: Well developed, well nourished, in no acute  distress. Patient is resting comfortably in her bed upon entering the room with a female in bed as well.  Head: Normocephalic, atraumatic, sclera non-icteric, no xanthomas, nares are without discharge.  Neck: Negative for carotid bruits. JVD not elevated. Lungs: Clear bilaterally to auscultation without wheezes, rales, or rhonchi. Breathing is unlabored. Heart: RRR with S1 S2. No murmurs, rubs, or gallops appreciated. Chest pain is 100% reproducible on exam.  Abdomen: Soft, non-tender, non-distended with normoactive bowel sounds. No hepatomegaly. No rebound/guarding. No obvious abdominal masses. Msk:  Strength and tone appear normal for  age. Extremities: No clubbing or cyanosis. No edema.  Distal pedal pulses are 2+ and equal bilaterally. Neuro: Alert and oriented X 3. No facial asymmetry. No focal deficit. Moves all extremities spontaneously. Psych:  Responds to questions appropriately with a normal affect.    Assessment and Plan:   1. Atypical chest pain:  -Her symptoms are pleuritic in nature and 100% reproducible to palpation on exam. Neither of which support cardiac etiology -Troponin was negative at [facility] 1 week ago and is negative x 3 this admission -Chemistries and CBC are unremarkable -CTA chest negative for PE and aortic dissection  -Echo has been ordered by IM, pending at this time -If echo is normal, no further cardiac workup is planned inpatient -Consider alternative etiologies including anxiety. Consider psych consult  2. Sinus tachycardia/palpitations: -Patient reports a nurse at her work told her she had Afib based on #1. There has been no documented evidence of Afib at [facility] or this admission.  -Sinus tachycardia is usually not a primary cardiac etiology  -I added a TSH to evaluate her thyroid function  -She reports orthostatic symptoms "everytime I change positions" -Would check orthostatics and hydrate as dictated by them -If the above is unremarkable, could consider outpatient cardiac monitoring   3. Possible anxiety: -Per IM  4. Hypokalemia: -Replete to 4.0 -Check magnesium and replete to 2.0 if needed   Melvern Banker, PA-C Pager: [phone] 02/07/2016, 8:25 AM

## 2016-02-07 NOTE — Discharge Summary (Signed)
[place] [facility] NAME: Kathleen Lloyd    MR#:  [id number]  DATE OF BIRTH:  [birth year]  DATE OF ADMISSION:  02/06/2016 ADMITTING PHYSICIAN: Lance Coon, MD  DATE OF DISCHARGE: 02/07/2016  PRIMARY CARE PHYSICIAN: Pcp Not In System    ADMISSION DIAGNOSIS:  Chest pain [R07.9]  DISCHARGE DIAGNOSIS:  Principal Problem:   Chest pain   SECONDARY DIAGNOSIS:   Past Medical History  Diagnosis Date  . UTI (lower urinary tract infection)   . Depression   . Vaginal delivery 05/2014    HOSPITAL COURSE:   1. Chest pain. This is a costochondritis with chest wall pain to palpation. I gave a dose of Solu-Medrol. I'll give a quick prednisone taper and as needed pain medication 3 days supply. Follow-up with cardiology as outpatient. Cardiac enzymes negative, echocardiogram normal. Telemetry monitoring showed periods of sinus tachycardia. CT scan of the chest was negative for pulmonary embolism or aortic dissection 2. Hypokalemia. Replace orally. Will replace orally for a few more days.  DISCHARGE CONDITIONS:   Satisfactory  CONSULTS OBTAINED:  Treatment Team:  Wellington Hampshire, MD  DRUG ALLERGIES:  No Known Allergies  DISCHARGE MEDICATIONS:   Current Discharge Medication List    START taking these medications   Details  HYDROcodone-acetaminophen (NORCO/VICODIN) 5-325 MG tablet Take 1 tablet by mouth every 6 (six) hours as needed for moderate pain. Qty: 12 tablet, Refills: 0    potassium chloride (K-DUR) 10 MEQ tablet Take 1 tablet (10 mEq total) by mouth daily. Qty: 3 tablet, Refills: 0    predniSONE (DELTASONE) 5 MG tablet 3 tabs po day1; 2 tabs po day2, 1 tab po day3,4 Qty: 7 tablet, Refills: 0      CONTINUE these medications which have NOT CHANGED   Details  ibuprofen (ADVIL,MOTRIN) 400 MG tablet Take 400 mg by mouth every 6 (six) hours as needed for fever or moderate pain.    medroxyPROGESTERone (DEPO-PROVERA) 150 MG/ML  injection Inject 150 mg into the muscle every 3 (three) months.         DISCHARGE INSTRUCTIONS:   Follow-up Dr. Fletcher Anon 1 week  If you experience worsening of your admission symptoms, develop shortness of breath, life threatening emergency, suicidal or homicidal thoughts you must seek medical attention immediately by calling 911 or calling your MD immediately  if symptoms less severe.  You Must read complete instructions/literature along with all the possible adverse reactions/side effects for all the Medicines you take and that have been prescribed to you. Take any new Medicines after you have completely understood and accept all the possible adverse reactions/side effects.   Please note  You were cared for by a hospitalist during your hospital stay. If you have any questions about your discharge medications or the care you received while you were in the hospital after you are discharged, you can call the unit and asked to speak with the hospitalist on call if the hospitalist that took care of you is not available. Once you are discharged, your primary care physician will handle any further medical issues. Please note that NO REFILLS for any discharge medications will be authorized once you are discharged, as it is imperative that you return to your primary care physician (or establish a relationship with a primary care physician if you do not have one) for your aftercare needs so that they can reassess your need for medications and monitor your lab values.    Today   CHIEF COMPLAINT:  Chief Complaint  Patient presents with  . Chest Pain    HISTORY OF PRESENT ILLNESS:  Kathleen Lloyd  is a 20 y.o. female presented with chest pain   VITAL SIGNS:  Blood pressure 108/52, pulse 72, temperature 98 F (36.7 C), temperature source Oral, resp. rate 18, height 5\' 1"  (1.549 m), weight 72.621 kg (160 lb 1.6 oz), SpO2 98 %, not currently breastfeeding.    PHYSICAL EXAMINATION:  GENERAL:  20  y.o.-year-old patient lying in the bed with no acute distress.  EYES: Pupils equal, round, reactive to light and accommodation. No scleral icterus. Extraocular muscles intact.  HEENT: Head atraumatic, normocephalic. Oropharynx and nasopharynx clear.  NECK:  Supple, no jugular venous distention. No thyroid enlargement, no tenderness.  LUNGS: Normal breath sounds bilaterally, no wheezing, rales,rhonchi or crepitation. No use of accessory muscles of respiration.  CARDIOVASCULAR: S1, S2 normal. No murmurs, rubs, or gallops. Chest pain to palpation ABDOMEN: Soft, non-tender, non-distended. Bowel sounds present. No organomegaly or mass.  EXTREMITIES: No pedal edema, cyanosis, or clubbing.  NEUROLOGIC: Cranial nerves II through XII are intact. Muscle strength 5/5 in all extremities. Sensation intact. Gait not checked.  PSYCHIATRIC: The patient is alert and oriented x 3.  SKIN: No obvious rash, lesion, or ulcer.   DATA REVIEW:   CBC  Recent Labs Lab 02/07/16 0530  WBC 5.5  HGB 12.0  HCT 36.2  PLT 179    Chemistries   Recent Labs Lab 02/07/16 0530 02/07/16 1141  NA 140  --   K 3.5  --   CL 111  --   CO2 22  --   GLUCOSE 91  --   BUN 13  --   CREATININE 0.65  --   CALCIUM 8.9  --   MG  --  1.9    Cardiac Enzymes  Recent Labs Lab 02/07/16 1141  TROPONINI <0.03       RADIOLOGY:  Ct Angio Chest Pe W/cm &/or Wo Cm  02/06/2016  CLINICAL DATA:  20 year old female with chest pain. EXAM: CT ANGIOGRAPHY CHEST WITH CONTRAST TECHNIQUE: Multidetector CT imaging of the chest was performed using the standard protocol during bolus administration of intravenous contrast. Multiplanar CT image reconstructions and MIPs were obtained to evaluate the vascular anatomy. CONTRAST:  61mL ISOVUE-300 IOPAMIDOL (ISOVUE-300) INJECTION 61% COMPARISON:  Chest radiograph dated 02/01/2016 FINDINGS: Evaluation of this exam is limited due to respiratory motion artifact. The lungs are clear. There is no  pleural effusion or pneumothorax. The central airways are patent. The thoracic aorta and the origins of the great vessels of the aortic arch appear patent. Set new evaluation of the pulmonary arteries is limited due to suboptimal visualization of the distal branches secondary to respiratory motion artifact. Go no central pulmonary artery embolus identified. There is no cardiomegaly or pericardial effusion. No hilar or mediastinal adenopathy. Residual thymic tissue noted in the anterior mediastinum. The esophagus is grossly unremarkable. There is no axillary adenopathy. The chest wall soft tissues appear unremarkable. The osseous structures are intact. Review of the MIP images confirms the above findings. IMPRESSION: No CT evidence of central pulmonary artery embolus. Electronically Signed   By: Anner Crete M.D.   On: 02/06/2016 18:28    Management plans discussed with the patient, family and they are in agreement.  CODE STATUS:     Code Status Orders        Start     Ordered   02/06/16 2334  Full code   Continuous  02/06/16 2333    Code Status History    Date Active Date Inactive Code Status Order ID Comments User Context   06/11/2014  4:59 PM 06/13/2014  2:25 PM Full Code [id number]  Donnel Saxon, CNM Inpatient   06/11/2014  3:49 AM 06/11/2014  4:59 PM Full Code [id number]  Gavin Pound, CNM Inpatient      TOTAL TIME TAKING CARE OF THIS PATIENT: 35 minutes.    Loletha Grayer M.D on 02/07/2016 at 2:16 PM  Between 7am to 6pm - Pager - [phone]  After 6pm go to [email or web address] - password [email or web address]  [facility]  [phone]  CC: Primary care physician; Pcp Not In System

## 2016-02-07 NOTE — Progress Notes (Signed)
*  PRELIMINARY RESULTS* Echocardiogram 2D Echocardiogram has been performed.  Kathleen Lloyd 02/07/2016, 9:51 AM

## 2016-02-11 ENCOUNTER — Telehealth: Payer: Self-pay | Admitting: Cardiology

## 2016-02-11 NOTE — Telephone Encounter (Signed)
lmov to call office.  Need pcp information for upcoming appt.

## 2016-02-20 ENCOUNTER — Ambulatory Visit (INDEPENDENT_AMBULATORY_CARE_PROVIDER_SITE_OTHER): Payer: Managed Care, Other (non HMO) | Admitting: Cardiology

## 2016-02-20 ENCOUNTER — Encounter: Payer: Self-pay | Admitting: Cardiology

## 2016-02-20 ENCOUNTER — Encounter: Payer: Managed Care, Other (non HMO) | Admitting: Cardiology

## 2016-02-20 VITALS — BP 100/70 | HR 74 | Ht 61.0 in | Wt 164.0 lb

## 2016-02-20 DIAGNOSIS — R079 Chest pain, unspecified: Secondary | ICD-10-CM | POA: Diagnosis not present

## 2016-02-20 DIAGNOSIS — R002 Palpitations: Secondary | ICD-10-CM | POA: Diagnosis not present

## 2016-02-20 DIAGNOSIS — R000 Tachycardia, unspecified: Secondary | ICD-10-CM

## 2016-02-20 NOTE — Patient Instructions (Addendum)
Medication Instructions:  Your physician recommends that you continue on your current medications as directed. Please refer to the Current Medication list given to you today.   Labwork: None ordered  Testing/Procedures: Your physician has recommended that you wear a holter monitor. Holter monitors are medical devices that record the heart's electrical activity. Doctors most often use these monitors to diagnose arrhythmias. Arrhythmias are problems with the speed or rhythm of the heartbeat. The monitor is a small, portable device. You can wear one while you do your normal daily activities. This is usually used to diagnose what is causing palpitations/syncope (passing out).  Date & Time:____________________________________________________  Your physician has requested that you have a stress echocardiogram. For further information please visit [email or web address]. Please follow instruction sheet as given.  Date & Time: __________________________________________________  Follow-Up: Your physician recommends that you schedule a follow-up appointment after testing to review with Dr. Yvone Neu.  Date & Time: ____________________________________________________   Any Other Special Instructions Will Be Listed Below (If Applicable).     If you need a refill on your cardiac medications before your next appointment, please call your pharmacy.  Exercise Stress Echocardiogram An exercise stress echocardiogram is a heart (cardiac) test used to check the function of your heart. This test may also be called an exercise stress echocardiography or stress echo. This stress test will check how well your heart muscle and valves are working and determine if your heart muscle is getting enough blood. You will exercise on a treadmill to naturally increase or stress the functioning of your heart.  An echocardiogram uses sound waves (ultrasound) to produce an image of your heart. If your heart does not work  normally, it may indicate coronary artery disease with poor coronary blood supply. The coronary arteries are the arteries that bring blood and oxygen to your heart. LET [facility] CARE PROVIDER KNOW ABOUT:  Any allergies you have.  All medicines you are taking, including vitamins, herbs, eye drops, creams, and over-the-counter medicines.  Previous problems you or members of your family have had with the use of anesthetics.  Any blood disorders you have.  Previous surgeries you have had.  Medical conditions you have.  Possibility of pregnancy, if this applies. RISKS AND COMPLICATIONS Generally, this is a safe procedure. However, as with any procedure, complications can occur. Possible complications can include:  You develop pain or pressure in the following areas:  Chest.  Jaw or neck.  Between your shoulder blades.  Radiating down your left arm.  Dizziness or lightheadedness.  Shortness of breath.  Increased or irregular heartbeat.  Nausea or vomiting.  Heart attack (rare). BEFORE THE PROCEDURE  Avoid all forms of caffeine for 24 hours before your test or as directed by your health care provider. This includes coffee, tea (even decaffeinated tea), caffeinated sodas, chocolate, cocoa, and certain pain medicines.  Follow your health care provider's instructions regarding eating and drinking before the test.  Take your medicines as directed at regular times with water unless instructed otherwise. Exceptions may include:  If you have diabetes, ask how you are to take your insulin or pills. It is common to adjust insulin dosing the morning of the test.  If you are taking beta-blocker medicines, it is important to talk to your health care provider about these medicines well before the date of your test. Taking beta-blocker medicines may interfere with the test. In some cases, these medicines need to be changed or stopped 24 hours or more before the test.  If  you wear a  nitroglycerin patch, it may need to be removed prior to the test. Ask your health care provider if the patch should be removed before the test.  If you use an inhaler for any breathing condition, bring it with you to the test.  If you are an outpatient, bring a snack so you can eat right after the stress phase of the test.  Do not smoke for 4 hours prior to the test or as directed by your health care provider.  Wear loose-fitting clothes and comfortable shoes for the test. This test involves walking on a treadmill. PROCEDURE   Multiple electrodes will be put on your chest. If needed, small areas of your chest may be shaved to get better contact with the electrodes. Once the electrodes are attached to your body, multiple wires will be attached to the electrodes, and your heart rate will be monitored.  You will have an echocardiogram done at rest.  To produce this image of your heart, gel is applied to your chest, and a wand-like tool (transducer) is moved over the chest. The transducer sends the sound waves through the chest to create the moving images of your heart.  You may need an IV to receive a medication that improves the quality of the pictures.  You will then walk on a treadmill. The treadmill will be started at a slow pace. The treadmill speed and incline will gradually be increased to raise your heart rate.  At the peak of exercise, the treadmill will be stopped. You will lie down immediately on a bed so that a second echocardiogram can be done to visualize your heart's motion with exercise.  The test usually takes 30-60 minutes to complete. AFTER THE PROCEDURE  Your heart rate and blood pressure will be monitored after the test.  You may return to your normal schedule, including diet, activities, and medicines, unless your health care provider tells you otherwise.   This information is not intended to replace advice given to you by your health care provider. Make sure you  discuss any questions you have with your health care provider.   Document Released: 09/12/2004 Document Revised: 09/13/2013 Document Reviewed: 05/16/2013 Elsevier Interactive Patient Education 2016 Elsevier Inc.   Holter Monitoring A Holter monitor is a small device that is used to detect abnormal heart rhythms. It clips to your clothing and is connected by wires to flat, sticky disks (electrodes) that attach to your chest. It is worn continuously for 24-48 hours. HOME CARE INSTRUCTIONS  Wear your Holter monitor at all times, even while exercising and sleeping, for as long as directed by your health care provider.  Make sure that the Holter monitor is safely clipped to your clothing or close to your body as recommended by your health care provider.  Do not get the monitor or wires wet.  Do not put body lotion or moisturizer on your chest.  Keep your skin clean.  Keep a diary of your daily activities, such as walking and doing chores. If you feel that your heartbeat is abnormal or that your heart is fluttering or skipping a beat:  Record what you are doing when it happens.  Record what time of day the symptoms occur.  Return your Holter monitor as directed by your health care provider.  Keep all follow-up visits as directed by your health care provider. This is important. SEEK IMMEDIATE MEDICAL CARE IF:  You feel lightheaded or you faint.  You have trouble breathing.  You feel pain in your chest, upper arm, or jaw.  You feel sick to your stomach and your skin is pale, cool, or damp.  You heartbeat feels unusual or abnormal.   This information is not intended to replace advice given to you by your health care provider. Make sure you discuss any questions you have with your health care provider.   Document Released: 06/06/2004 Document Revised: 09/29/2014 Document Reviewed: 04/17/2014 Elsevier Interactive Patient Education [employer].

## 2016-02-22 ENCOUNTER — Encounter: Payer: Self-pay | Admitting: Cardiology

## 2016-02-22 NOTE — Progress Notes (Signed)
Cardiology Office Note   Date:  02/22/2016 02/20/2016 visit  ID:  Kathleen Lloyd, DOB [birth year], MRN [id number]  Referring Doctor:  No primary care provider on file.   Cardiologist:   Wende Bushy, MD   Reason for consultation:  Chief Complaint  Patient presents with  . other    F/u [facility] due to chest pain c/o chest pain and rapid heart beat yesterday 186HR. Meds reviewed verbally with pt.      History of Present Illness: Kathleen Lloyd is a 20 y.o. female who presents for follow-up from the hospital. She presented with chest pain and palpitations. On 02/07/2016, she complained of chest pain that was noted to be pleuritic at that time. Also, it was noted to be reproducible to touch. Currently there is no producible chest pain on palpation. She continues to have pleuritic chest pain, brief in duration, nonradiating, randomly occurring. Her workup in the hospital was negative troponins and imaging was negative for PE and aortic dissection. However, she presents to the office with persistence of symptoms.  Also, she reports occasional episodes of palpitations, feeling her heart racing. Severity is moderate, brief in duration, randomly occurring  No fever, cough, colds, abdominal pain no orthopnea PND or edema.   ROS:  Please see the history of present illness. Aside from mentioned under HPI, all other systems are reviewed and negative.     Past Medical History  Diagnosis Date  . UTI (lower urinary tract infection)   . Depression   . Vaginal delivery 05/2014    Past Surgical History  Procedure Laterality Date  . Mass excision Right 07/25/2014    Procedure: REMOVAL OF RIGHT AXILLA MASS;  Surgeon: Michael Boston, MD;  Location: [facility];  Service: General;  Laterality: Right;     reports that she has never smoked. She has never used smokeless tobacco. She reports that she does not drink alcohol or use illicit drugs.   family history includes Cancer in her father and paternal  grandmother; Hypertension in her paternal grandmother; Stroke in her paternal aunt.   Current Outpatient Prescriptions  Medication Sig Dispense Refill  . HYDROcodone-acetaminophen (NORCO/VICODIN) 5-325 MG tablet Take 1 tablet by mouth every 6 (six) hours as needed for moderate pain. 12 tablet 0  . ibuprofen (ADVIL,MOTRIN) 400 MG tablet Take 400 mg by mouth every 6 (six) hours as needed for fever or moderate pain.    . medroxyPROGESTERone (DEPO-PROVERA) 150 MG/ML injection Inject 150 mg into the muscle every 3 (three) months.     No current facility-administered medications for this visit.    Allergies: Review of patient's allergies indicates no known allergies.    PHYSICAL EXAM: VS:  BP 100/70 mmHg  Pulse 74  Ht 5\' 1"  (1.549 m)  Wt 164 lb (74.39 kg)  BMI 31.00 kg/m2 , Body mass index is 31 kg/(m^2). Wt Readings from Last 3 Encounters:  02/20/16 164 lb (74.39 kg)  02/06/16 160 lb 1.6 oz (72.621 kg)  01/31/16 160 lb (72.576 kg)    GENERAL:  well developed, well nourished, obese, not in acute distress HEENT: normocephalic, pink conjunctivae, anicteric sclerae, no xanthelasma, normal dentition, oropharynx clear NECK:  no neck vein engorgement, JVP normal, no hepatojugular reflux, carotid upstroke brisk and symmetric, no bruit, no thyromegaly, no lymphadenopathy LUNGS:  good respiratory effort, clear to auscultation bilaterally CV:  PMI not displaced, no thrills, no lifts, S1 and S2 within normal limits, no palpable S3 or S4, no murmurs, no rubs, no gallops  ABD:  Soft, nontender, nondistended, normoactive bowel sounds, no abdominal aortic bruit, no hepatomegaly, no splenomegaly MS: nontender back, no kyphosis, no scoliosis, no joint deformities EXT:  2+ DP/PT pulses, no edema, no varicosities, no cyanosis, no clubbing SKIN: warm, nondiaphoretic, normal turgor, no ulcers NEUROPSYCH: alert, oriented to person, place, and time, sensory/motor grossly intact, normal mood, appropriate  affect  Recent Labs: 02/07/2016: BUN 13; Creatinine, Ser 0.65; Hemoglobin 12.0; Magnesium 1.9; Platelets 179; Potassium 3.5; Sodium 140; TSH 0.913   Lipid Panel No results found for: CHOL, TRIG, HDL, CHOLHDL, VLDL, LDLCALC, LDLDIRECT   Other studies Reviewed:  EKG:  The ekg from 02/20/2016 was personally reviewed by me and it revealed sinus rhythm, 74 BPM  Additional studies/ records that were reviewed personally reviewed by me today include:  Echo 02/07/2016: Left ventricle: The cavity size was normal. Wall thickness was  normal. Systolic function was normal. The estimated ejection  fraction was in the range of 55% to 60%. Wall motion was normal;  there were no regional wall motion abnormalities. Left  ventricular diastolic function parameters were normal.    ASSESSMENT AND PLAN:  Chest pain, pleuritic in nature Atypical for cardiac presentation. For patient reassurance, recommend stress testing.  Palpitations Recommend objective evidence of arrhythmia on Holter monitor.  Current medicines are reviewed at length with the patient today.  The patient does not have concerns regarding medicines.  Labs/ tests ordered today include: Orders Placed This Encounter  Procedures  . Holter monitor - 24 hour  . EKG 12-Lead  . ECHO STRESS TEST    I had a lengthy and detailed discussion with the patient regarding diagnoses, prognosis, diagnostic options, treatment options .   I counseled the patient on importance of lifestyle modification including heart healthy diet, regular physical activity.   Disposition:   FU with undersigned after tests    Signed, Wende Bushy, MD  02/22/2016 9:25 AM    [facility]

## 2016-03-04 ENCOUNTER — Telehealth: Payer: Self-pay | Admitting: Cardiology

## 2016-03-04 ENCOUNTER — Other Ambulatory Visit: Payer: Managed Care, Other (non HMO)

## 2016-03-04 ENCOUNTER — Other Ambulatory Visit: Payer: Self-pay

## 2016-03-04 DIAGNOSIS — R079 Chest pain, unspecified: Secondary | ICD-10-CM

## 2016-03-04 NOTE — Telephone Encounter (Signed)
Per Dr. Yvone Neu: "Stress echo or exercise stress test with echo was ordered in case her EKG tracings turn out to be abnormal, which happens a lot in women/[place] women. If insurance will not cover for this, then we don't have any other recourse but to order plain treadmill test. Thank you. "  Stress echo cancelled, GXT ordered same day and time. Called pt to inform of the change. No answer, no voice mail

## 2016-03-05 NOTE — Telephone Encounter (Signed)
No answer and no voicemail available.

## 2016-03-06 NOTE — Telephone Encounter (Signed)
Attempted to call 6/13, 6/14, 6/15 with no success.

## 2016-03-06 NOTE — Telephone Encounter (Signed)
Attempted to call and this number is no longer active. Also tried to call boyfriend per release form and there was no answer and no voicemail set up. Patient has upcoming appointments and placed appointment note to get updated phone number.

## 2016-03-10 ENCOUNTER — Encounter: Payer: Self-pay | Admitting: *Deleted

## 2016-03-10 ENCOUNTER — Ambulatory Visit: Payer: Managed Care, Other (non HMO)

## 2016-03-10 ENCOUNTER — Ambulatory Visit (INDEPENDENT_AMBULATORY_CARE_PROVIDER_SITE_OTHER): Payer: Managed Care, Other (non HMO)

## 2016-03-10 ENCOUNTER — Other Ambulatory Visit: Payer: Managed Care, Other (non HMO)

## 2016-03-10 DIAGNOSIS — R079 Chest pain, unspecified: Secondary | ICD-10-CM | POA: Diagnosis not present

## 2016-03-10 DIAGNOSIS — R002 Palpitations: Secondary | ICD-10-CM

## 2016-03-11 LAB — EXERCISE TOLERANCE TEST
CHL CUP MPHR: 199 {beats}/min
CHL CUP RESTING HR STRESS: 91 {beats}/min
CSEPED: 6 min
CSEPEDS: 19 s
CSEPEW: 7.4 METS
Peak HR: 176 {beats}/min
Percent HR: 88 %

## 2016-03-26 ENCOUNTER — Encounter: Payer: Self-pay | Admitting: *Deleted

## 2016-03-26 ENCOUNTER — Ambulatory Visit: Payer: Managed Care, Other (non HMO) | Admitting: Cardiology

## 2016-03-26 DIAGNOSIS — R0989 Other specified symptoms and signs involving the circulatory and respiratory systems: Secondary | ICD-10-CM

## 2016-05-13 ENCOUNTER — Emergency Department
Admission: EM | Admit: 2016-05-13 | Discharge: 2016-05-13 | Disposition: A | Discharge status: Home / Self Care | Payer: Managed Care, Other (non HMO) | Attending: Student in an Organized Health Care Education/Training Program | Admitting: Student in an Organized Health Care Education/Training Program

## 2016-05-13 ENCOUNTER — Encounter: Payer: Self-pay | Admitting: Emergency Medicine

## 2016-05-13 ENCOUNTER — Emergency Department: Payer: Managed Care, Other (non HMO)

## 2016-05-13 DIAGNOSIS — Y929 Unspecified place or not applicable: Secondary | ICD-10-CM | POA: Diagnosis not present

## 2016-05-13 DIAGNOSIS — X501XXA Overexertion from prolonged static or awkward postures, initial encounter: Secondary | ICD-10-CM | POA: Insufficient documentation

## 2016-05-13 DIAGNOSIS — S93402A Sprain of unspecified ligament of left ankle, initial encounter: Secondary | ICD-10-CM | POA: Diagnosis not present

## 2016-05-13 DIAGNOSIS — Y999 Unspecified external cause status: Secondary | ICD-10-CM | POA: Diagnosis not present

## 2016-05-13 DIAGNOSIS — Y9301 Activity, walking, marching and hiking: Secondary | ICD-10-CM | POA: Diagnosis not present

## 2016-05-13 DIAGNOSIS — S99912A Unspecified injury of left ankle, initial encounter: Secondary | ICD-10-CM | POA: Diagnosis present

## 2016-05-13 DIAGNOSIS — Z791 Long term (current) use of non-steroidal anti-inflammatories (NSAID): Secondary | ICD-10-CM | POA: Insufficient documentation

## 2016-05-13 MED ORDER — NAPROXEN 500 MG PO TABS: 500.0000 mg | ORAL_TABLET | Freq: Two times a day (BID) | ORAL | 0 refills | Status: AC

## 2016-05-13 NOTE — ED Notes (Signed)
Pt reports this am was carrying her son down the stairs, slipped twisting her ankle and falling. Pt c/o pain to left foot and ankle. Reports unable to walk on. Pt denies any other injuries. Pulse present, skin warm and dry.

## 2016-05-13 NOTE — ED Provider Notes (Signed)
[facility] Provider Note   CSN: [id number] Arrival date & time: 05/13/16  1020     History   Chief Complaint Chief Complaint  Patient presents with  . Foot Pain    HPI Kathleen Lloyd is a 20 y.o. female.  20 year old female presents today complaining of left lateral ankle and foot pain secondary to injury that occurred this morning. Pt states she tripped walking down the stairs while carrying her son inverting her left foot and ankle. She felt a 'pop' after this happened and it has been painful to ambulate ever since. Has not taken anything for pain this morning.    The history is provided by the patient.  Foot Pain  This is a new problem. The current episode started 1 to 2 hours ago. The problem occurs constantly. The problem has not changed since onset.The symptoms are aggravated by walking and twisting. The symptoms are relieved by rest. She has tried rest for the symptoms. The treatment provided mild relief.    Past Medical History:  Diagnosis Date  . Depression   . UTI (lower urinary tract infection)   . Vaginal delivery 05/2014    Patient Active Problem List   Diagnosis Date Noted  . Chest pain 02/06/2016  . Pelvic pain in female 09/22/2015  . Vaginal delivery 06/11/2014  . Cellulitis of axilla, right 03/15/2014    Past Surgical History:  Procedure Laterality Date  . MASS EXCISION Right 07/25/2014   Procedure: REMOVAL OF RIGHT AXILLA MASS;  Surgeon: Michael Boston, MD;  Location: [facility];  Service: General;  Laterality: Right;    OB History    Gravida Para Term Preterm AB Living   1 1 1     1    SAB TAB Ectopic Multiple Live Births           1       Home Medications    Prior to Admission medications   Medication Sig Start Date End Date Taking? Authorizing Provider  ibuprofen (ADVIL,MOTRIN) 400 MG tablet Take 400 mg by mouth every 6 (six) hours as needed for fever or moderate pain.    Historical Provider, MD  medroxyPROGESTERone  (DEPO-PROVERA) 150 MG/ML injection Inject 150 mg into the muscle every 3 (three) months.    Historical Provider, MD  naproxen (NAPROSYN) 500 MG tablet Take 1 tablet (500 mg total) by mouth 2 (two) times daily with a meal. 05/13/16 05/28/16  Harvest Dark, PA-C    Family History Family History  Problem Relation Age of Onset  . Cancer Father     melanoma  . Stroke Paternal Aunt   . Hypertension Paternal Grandmother   . Cancer Paternal Grandmother     Social History Social History  Substance Use Topics  . Smoking status: Never Smoker  . Smokeless tobacco: Never Used  . Alcohol use No     Comment: occ     Allergies   Review of patient's allergies indicates no known allergies.   Review of Systems Review of Systems  Musculoskeletal: Positive for arthralgias, joint swelling and myalgias. Negative for gait problem.  Skin: Negative for wound.  Neurological: Negative for weakness and numbness.  All other systems reviewed and are negative.    Physical Exam Updated Vital Signs BP 115/72 (BP Location: Right Arm)   Pulse 91   Temp 98.5 F (36.9 C)   Resp 20   Ht 5\' 1"  (1.549 m)   Wt 72.6 kg   SpO2 98%  BMI 30.23 kg/m   Physical Exam  Constitutional: She is oriented to person, place, and time. She appears well-developed and well-nourished.  HENT:  Head: Normocephalic and atraumatic.  Musculoskeletal: She exhibits tenderness. She exhibits no deformity.       Left ankle: She exhibits decreased range of motion and swelling. She exhibits no ecchymosis, no deformity and normal pulse. Tenderness. Lateral malleolus tenderness found. No proximal fibula tenderness found. Achilles tendon normal.       Left foot: There is tenderness and bony tenderness. There is normal range of motion.       Feet:  Neurological: She is alert and oriented to person, place, and time.  Skin: Skin is warm and dry. Capillary refill takes less than 2 seconds.  Psychiatric: She has a normal mood and  affect. Her behavior is normal. Judgment and thought content normal.  Nursing note and vitals reviewed.    ED Treatments / Results  Labs (all labs ordered are listed, but only abnormal results are displayed) Labs Reviewed - No data to display  EKG  EKG Interpretation None       Radiology Dg Ankle Complete Left  Result Date: 05/13/2016 CLINICAL DATA:  Fall.  Ankle pain EXAM: LEFT ANKLE COMPLETE - 3+ VIEW COMPARISON:  None. FINDINGS: There is no evidence of fracture, dislocation, or joint effusion. There is no evidence of arthropathy or other focal bone abnormality. Soft tissues are unremarkable. IMPRESSION: Negative. Electronically Signed   By: Franchot Gallo M.D.   On: 05/13/2016 10:55   Dg Foot Complete Left  Result Date: 05/13/2016 CLINICAL DATA:  Fall.  Left foot pain EXAM: LEFT FOOT - COMPLETE 3+ VIEW COMPARISON:  None. FINDINGS: There is no evidence of fracture or dislocation. There is no evidence of arthropathy or other focal bone abnormality. Soft tissues are unremarkable. IMPRESSION: Negative. Electronically Signed   By: Franchot Gallo M.D.   On: 05/13/2016 10:55    Procedures Procedures (including critical care time)  Medications Ordered in ED Medications - No data to display   Initial Impression / Assessment and Plan / ED Course  I have reviewed the triage vital signs and the nursing notes.  Pertinent labs & imaging results that were available during my care of the patient were reviewed by me and considered in my medical decision making (see chart for details).  Clinical Course    Negative foot and ankle xrays-  Ankle sprain. Treat with ACE wrap, elevation, naproxen 500mg  BId with food. Follow up with ortho for persistent or worsening symptoms   Final Clinical Impressions(s) / ED Diagnoses   Final diagnoses:  Ankle sprain, left, initial encounter    New Prescriptions New Prescriptions   NAPROXEN (NAPROSYN) 500 MG TABLET    Take 1 tablet (500 mg total) by  mouth 2 (two) times daily with a meal.  Dictation #1 [id number]  [place], PA-C 05/13/16 [place], PA-C 05/13/16 1100    Medical screening examination/treatment/procedure(s) were performed by non-physician practitioner and as supervising physician I was immediately available for consultation/collaboration. Denna Haggard, MD 07/02/16 [phone]

## 2016-05-13 NOTE — ED Triage Notes (Signed)
Twisted left ankle falling, now painful to walk on.

## 2016-07-03 NOTE — ED Provider Notes (Signed)
Medical screening examination/treatment/procedure(s) were performed by non-physician practitioner and as supervising physician I was immediately available for consultation/collaboration.     Earleen Newport, MD 07/03/16 [phone]

## 2016-09-01 ENCOUNTER — Telehealth: Payer: Self-pay | Admitting: Cardiology

## 2016-09-01 NOTE — Telephone Encounter (Signed)
Reviewed patient symptoms with Christell Faith, PA who agreed with plan.

## 2016-09-01 NOTE — Telephone Encounter (Signed)
Pt c/o of Chest Pain: STAT if CP now or developed within 24 hours  1. Are you having CP right now? More pressure, yes having now  2. Are you experiencing any other symptoms (ex. SOB, nausea, vomiting, sweating)? Headache and feet are swollen  3. How long have you been experiencing CP? Feet have been swollen since Saturday, chest pressure since yesterday  4. Is your CP continuous or coming and going? continual  5. Have you taken Nitroglycerin? no ?

## 2016-09-01 NOTE — Telephone Encounter (Signed)
Received incoming call from patient. Patient stated she started having chest pain described as achy since yesterday.  It is right above her left chest and up to her collar bone. She woke up this morning and it has been constant all day today. She says its a "6" out of 10 pain level. BP 121/76,  HR 83. Denies Palpitations, SOB, Nausea or vomiting. Denies any heavy lifting or straining recently. She also reports swelling in her ankles/feet and headache since Saturday night after work.  She works at [employer] and is on and off her feet. In May 2017, she was in the ED for chest pain/palpitations.  Echo was normal, CXR neg, troponin neg. ETT was ok per Dr Yvone Neu. Patient was ordered a holter and stress echo in May but this was never done.  Dr Yvone Neu out of office this week and no APP visits available. Patient added on with Dr End on 09/03/16 to be seen.  Pt verbalized understanding to go to the emergency room or call 911 if the chest pain worsens and he develops other symptoms, such as SOB, nausea, vomiting, or sweating.

## 2016-09-03 ENCOUNTER — Institutional Professional Consult (permissible substitution): Payer: Managed Care, Other (non HMO) | Admitting: Internal Medicine

## 2016-09-19 ENCOUNTER — Ambulatory Visit (INDEPENDENT_AMBULATORY_CARE_PROVIDER_SITE_OTHER): Payer: Managed Care, Other (non HMO) | Admitting: Cardiology

## 2016-09-19 ENCOUNTER — Encounter: Payer: Self-pay | Admitting: Cardiology

## 2016-09-19 VITALS — BP 110/80 | HR 76 | Ht 61.0 in | Wt 185.5 lb

## 2016-09-19 DIAGNOSIS — R0789 Other chest pain: Secondary | ICD-10-CM | POA: Diagnosis not present

## 2016-09-19 DIAGNOSIS — R002 Palpitations: Secondary | ICD-10-CM | POA: Diagnosis not present

## 2016-09-19 NOTE — Patient Instructions (Signed)
Follow-Up: Your physician recommends that you schedule a follow-up appointment as needed with Dr. Ingal.   It was a pleasure seeing you today here in the office. Please do not hesitate to give us a call back if you have any further questions. [phone]  Pamela A. RN, BSN     

## 2016-09-19 NOTE — Progress Notes (Signed)
Cardiology Office Note   Date:  09/19/2016 02/20/2016 visit  ID:  Kathleen Lloyd, DOB [birth year], MRN [id number]  Referring Doctor:  No PCP Per Patient   Cardiologist:   Wende Bushy, MD   Reason for consultation:  Chief Complaint  Patient presents with  . other     Overdue F/u post stress test. Pt call- c/o chest pressure/pain. Pt states she is currently having chest pressure/pain now and is concerned about weight gain and swollen ankles. Reviewed meds with pt verbally.      History of Present Illness: MARDEL Lloyd is a 20 y.o. female who presents for follow-up After testing  Since last visit, patient occasionally would have left-sided chest pain, upper chest, Reproducible with palpation. No shortness of breath. No palpitations this time.  No fever, cough, colds, abdominal pain no orthopnea PND. She reports swelling in her ankles.   ROS:  Please see the history of present illness. Aside from mentioned under HPI, all other systems are reviewed and negative.     Past Medical History:  Diagnosis Date  . Depression   . UTI (lower urinary tract infection)   . Vaginal delivery 05/2014    Past Surgical History:  Procedure Laterality Date  . MASS EXCISION Right 07/25/2014   Procedure: REMOVAL OF RIGHT AXILLA MASS;  Surgeon: Michael Boston, MD;  Location: [facility];  Service: General;  Laterality: Right;     reports that she has never smoked. She has never used smokeless tobacco. She reports that she does not drink alcohol or use drugs.   family history includes Cancer in her father and paternal grandmother; Hypertension in her paternal grandmother; Stroke in her paternal aunt.   Current Outpatient Prescriptions  Medication Sig Dispense Refill  . ibuprofen (ADVIL,MOTRIN) 400 MG tablet Take 400 mg by mouth every 6 (six) hours as needed for fever or moderate pain.    Marland Kitchen NIKKI 3-0.02 MG tablet      No current facility-administered medications for this visit.     Allergies:  Patient has no known allergies.    PHYSICAL EXAM: VS:  BP 110/80 (BP Location: Left Arm, Patient Position: Sitting, Cuff Size: Normal)   Pulse 76   Ht 5\' 1"  (1.549 m)   Wt 185 lb 8 oz (84.1 kg)   BMI 35.05 kg/m  , Body mass index is 35.05 kg/m. Wt Readings from Last 3 Encounters:  09/19/16 185 lb 8 oz (84.1 kg)  05/13/16 160 lb (72.6 kg)  02/20/16 164 lb (74.4 kg)    GENERAL:  well developed, well nourished, obese, not in acute distress HEENT: normocephalic, pink conjunctivae, anicteric sclerae, no xanthelasma, normal dentition, oropharynx clear NECK:  no neck vein engorgement, JVP normal, no hepatojugular reflux, carotid upstroke brisk and symmetric, no bruit, no thyromegaly, no lymphadenopathy LUNGS:  good respiratory effort, clear to auscultation bilaterally CV:  PMI not displaced, no thrills, no lifts, S1 and S2 within normal limits, no palpable S3 or S4, no murmurs, no rubs, no gallops ABD:  Soft, nontender, nondistended, normoactive bowel sounds, no abdominal aortic bruit, no hepatomegaly, no splenomegaly MS: nontender back, no kyphosis, no scoliosis, no joint deformities EXT:  2+ DP/PT pulses, no edema, no varicosities, no cyanosis, no clubbing SKIN: warm, nondiaphoretic, normal turgor, no ulcers NEUROPSYCH: alert, oriented to person, place, and time, sensory/motor grossly intact, normal mood, appropriate affect  Recent Labs: 02/07/2016: BUN 13; Creatinine, Ser 0.65; Hemoglobin 12.0; Magnesium 1.9; Platelets 179; Potassium 3.5; Sodium 140; TSH 0.913  Lipid Panel No results found for: CHOL, TRIG, HDL, CHOLHDL, VLDL, LDLCALC, LDLDIRECT   Other studies Reviewed:  EKG:  The ekg from 02/20/2016 was personally reviewed by me and it revealed sinus rhythm, 74 BPM  Additional studies/ records that were reviewed personally reviewed by me today include:  Echo 02/07/2016: Left ventricle: The cavity size was normal. Wall thickness was  normal. Systolic function was normal. The  estimated ejection  fraction was in the range of 55% to 60%. Wall motion was normal;  there were no regional wall motion abnormalities. Left  ventricular diastolic function parameters were normal.  Exercise stress test 03/11/2016: Patient demonstrated normal functional capacity. Patient achieved 7.4 METs and reached 88% of maximum predicted heart rate. Hemodynamic response to exercise was appropriate. No chest pain.  Artifact noted throughout the study but no definite ST depression noted. No ischemic EKG changes.   Overall, normal exercise stress test. This  portends a low likelihood of clinically significant CAD.  ASSESSMENT AND PLAN:  Chest pain Likely musculoskeletal, significantly reproducible on palpation Exercise stress test was normal 03/11/2016. Low likelihood of clinically significant CAD. Recommend trial with NSAIDs, to be taken on full stomach, recommend adequate hydration and recommend follow-up with PCP  Palpitations Patient did not go for Holter. No recurrence of palpitations.  Current medicines are reviewed at length with the patient today.  The patient does not have concerns regarding medicines.  Labs/ tests ordered today include:  Orders Placed This Encounter  Procedures  . EKG 12-Lead    I had a lengthy and detailed discussion with the patient regarding diagnoses, prognosis, diagnostic options, treatment options .   I counseled the patient on importance of lifestyle modification including heart healthy diet, regular physical activity.   Disposition:   FU with undersigned prn  Signed, Wende Bushy, MD  09/19/2016 10:07 AM    [name]

## 2017-02-06 ENCOUNTER — Encounter (HOSPITAL_COMMUNITY): Payer: Self-pay | Admitting: Nurse Practitioner

## 2017-02-06 ENCOUNTER — Emergency Department (HOSPITAL_COMMUNITY)
Admission: EM | Admit: 2017-02-06 | Discharge: 2017-02-07 | Disposition: A | Discharge status: Home / Self Care | Payer: Managed Care, Other (non HMO) | Attending: Emergency Medicine | Admitting: Emergency Medicine

## 2017-02-06 DIAGNOSIS — N390 Urinary tract infection, site not specified: Secondary | ICD-10-CM | POA: Diagnosis not present

## 2017-02-06 DIAGNOSIS — R1031 Right lower quadrant pain: Secondary | ICD-10-CM | POA: Diagnosis present

## 2017-02-06 DIAGNOSIS — Z79899 Other long term (current) drug therapy: Secondary | ICD-10-CM | POA: Insufficient documentation

## 2017-02-06 NOTE — ED Triage Notes (Signed)
Pt is of RLQ pain that she notes started 2 days ago, has been intermittent but worsened today. Denies any GI hx

## 2017-02-07 ENCOUNTER — Encounter (HOSPITAL_COMMUNITY): Payer: Self-pay | Admitting: Radiology

## 2017-02-07 ENCOUNTER — Emergency Department (HOSPITAL_COMMUNITY): Payer: Managed Care, Other (non HMO)

## 2017-02-07 LAB — URINALYSIS, ROUTINE W REFLEX MICROSCOPIC
Bilirubin Urine: NEGATIVE
Glucose, UA: NEGATIVE mg/dL
Hgb urine dipstick: NEGATIVE
Ketones, ur: 20 mg/dL — AB
Nitrite: NEGATIVE
Protein, ur: NEGATIVE mg/dL
Specific Gravity, Urine: 1.006 (ref 1.005–1.030)
pH: 5 (ref 5.0–8.0)

## 2017-02-07 LAB — CBC WITH DIFFERENTIAL/PLATELET
Basophils Absolute: 0 10*3/uL (ref 0.0–0.1)
Basophils Relative: 0 %
Eosinophils Absolute: 0.1 10*3/uL (ref 0.0–0.7)
Eosinophils Relative: 1 %
HCT: 37.1 % (ref 36.0–46.0)
Hemoglobin: 12.1 g/dL (ref 12.0–15.0)
Lymphocytes Relative: 23 %
Lymphs Abs: 2.1 10*3/uL (ref 0.7–4.0)
MCH: 28.8 pg (ref 26.0–34.0)
MCHC: 32.6 g/dL (ref 30.0–36.0)
MCV: 88.3 fL (ref 78.0–100.0)
Monocytes Absolute: 0.5 10*3/uL (ref 0.1–1.0)
Monocytes Relative: 5 %
Neutro Abs: 6.4 10*3/uL (ref 1.7–7.7)
Neutrophils Relative %: 71 %
Platelets: 258 10*3/uL (ref 150–400)
RBC: 4.2 MIL/uL (ref 3.87–5.11)
RDW: 13.3 % (ref 11.5–15.5)
WBC: 9.1 10*3/uL (ref 4.0–10.5)

## 2017-02-07 LAB — COMPREHENSIVE METABOLIC PANEL
ALT: 19 U/L (ref 14–54)
AST: 23 U/L (ref 15–41)
Albumin: 3.9 g/dL (ref 3.5–5.0)
Alkaline Phosphatase: 59 U/L (ref 38–126)
Anion gap: 11 (ref 5–15)
BUN: 17 mg/dL (ref 6–20)
CO2: 22 mmol/L (ref 22–32)
Calcium: 9.5 mg/dL (ref 8.9–10.3)
Chloride: 105 mmol/L (ref 101–111)
Creatinine, Ser: 0.63 mg/dL (ref 0.44–1.00)
GFR calc Af Amer: >60 mL/min (ref >60)
GFR calc non Af Amer: >60 mL/min (ref >60)
Glucose, Bld: 91 mg/dL (ref 65–99)
Potassium: 3.6 mmol/L (ref 3.5–5.1)
Sodium: 138 mmol/L (ref 135–145)
Total Bilirubin: 0.7 mg/dL (ref 0.3–1.2)
Total Protein: 7.3 g/dL (ref 6.5–8.1)

## 2017-02-07 LAB — PREGNANCY, URINE: Preg Test, Ur: NEGATIVE

## 2017-02-07 MED ORDER — ONDANSETRON HCL 4 MG/2ML IJ SOLN
4.0000 mg | Freq: Once | INTRAMUSCULAR | Status: AC
  Administered 2017-02-07: 4 mg via INTRAVENOUS
  Filled 2017-02-07: qty 2

## 2017-02-07 MED ORDER — MORPHINE SULFATE (PF) 4 MG/ML IV SOLN
4.0000 mg | Freq: Once | INTRAVENOUS | Status: AC
  Administered 2017-02-07: 4 mg via INTRAVENOUS
  Filled 2017-02-07: qty 1

## 2017-02-07 MED ORDER — IOPAMIDOL (ISOVUE-300) INJECTION 61%
INTRAVENOUS | Status: AC
  Filled 2017-02-07: qty 100

## 2017-02-07 MED ORDER — TRAMADOL HCL 50 MG PO TABS: 50.0000 mg | ORAL_TABLET | Freq: Four times a day (QID) | ORAL | 0 refills | Status: DC | PRN

## 2017-02-07 MED ORDER — SULFAMETHOXAZOLE-TRIMETHOPRIM 800-160 MG PO TABS: 1.0000 | ORAL_TABLET | Freq: Two times a day (BID) | ORAL | 0 refills | Status: AC

## 2017-02-07 MED ORDER — IOPAMIDOL (ISOVUE-300) INJECTION 61%
100.0000 mL | Freq: Once | INTRAVENOUS | Status: AC | PRN
  Administered 2017-02-07: 100 mL via INTRAVENOUS

## 2017-02-07 NOTE — Discharge Instructions (Signed)
Please read attached information. If you experience any new or worsening signs or symptoms please return to the emergency room for evaluation. Please follow-up with your primary care provider or specialist as discussed. Please use medication prescribed only as directed and discontinue taking if you have any concerning signs or symptoms.   °

## 2017-02-07 NOTE — ED Provider Notes (Signed)
[facility] DEPT Provider Note   CSN: [id number] Arrival date & time: 02/06/17  2323  By signing my name below, I, [name], attest that this documentation has been prepared under the direction and in the presence of non-physician practitioner, Okey Regal, PA-C. Electronically Signed: [name], Scribe. 02/07/2017. 12:11 AM.  History   Chief Complaint Chief Complaint  Patient presents with  . Abdominal Pain    RLQ   The history is provided by the patient. No language interpreter was used.   HPI Comments: Kathleen Lloyd is a 21 y.o. female who presents to the Emergency Department complaining of constant moderate RLQ abdominal pain that started 2 days ago. She had mild intermittent abdominal cramps until today when her pain suddenly worsen and became sharp. She took 800 mg ibuprofen with no relief. Her pain is exacerbated by ambulation. She reports associated nausea and vomiting (onset 2 days ago). Denies any prior hx of similar complaint, hx of abdominal surgeries, fever, constipation, diarrhea, vaginal bleeding/discharge,  or other complaints at this time.Patient does note that she recently was taking amoxicillin for upper respiratory infection, she notes she took it for several days, and was noted to have pain with urination while taking this medication.   Past Medical History:  Diagnosis Date  . Depression   . UTI (lower urinary tract infection)   . Vaginal delivery 05/2014    Patient Active Problem List   Diagnosis Date Noted  . Chest pain 02/06/2016  . Pelvic pain in female 09/22/2015  . Vaginal delivery 06/11/2014  . Cellulitis of axilla, right 03/15/2014    Past Surgical History:  Procedure Laterality Date  . MASS EXCISION Right 07/25/2014   Procedure: REMOVAL OF RIGHT AXILLA MASS;  Surgeon: Michael Boston, MD;  Location: [facility] ORS;  Service: General;  Laterality: Right;    OB History    Gravida Para Term Preterm AB Living   1 1 1     1    SAB TAB Ectopic  Multiple Live Births           1       Home Medications    Prior to Admission medications   Medication Sig Start Date End Date Taking? Authorizing Provider  ibuprofen (ADVIL,MOTRIN) 800 MG tablet Take 800 mg by mouth every 8 (eight) hours as needed for headache, mild pain or moderate pain.    Yes [provider]  NIKKI 3-0.02 MG tablet Take 1 tablet by mouth daily.  09/14/16  Yes [provider]  sulfamethoxazole-trimethoprim (BACTRIM DS,SEPTRA DS) 800-160 MG tablet Take 1 tablet by mouth 2 (two) times daily. 02/07/17 02/14/17  Okey Regal, PA-C    Family History Family History  Problem Relation Age of Onset  . Cancer Father        melanoma  . Stroke Paternal Aunt   . Hypertension Paternal Grandmother   . Cancer Paternal Grandmother     Social History Social History  Substance Use Topics  . Smoking status: Never Smoker  . Smokeless tobacco: Never Used  . Alcohol use No     Comment: occ     Allergies   Patient has no known allergies.   Review of Systems Review of Systems  Constitutional: Negative for fever.  Gastrointestinal: Positive for abdominal pain (RLQ), nausea and vomiting. Negative for constipation and diarrhea.  Genitourinary: Negative for dysuria, vaginal bleeding and vaginal discharge.  All other systems reviewed and are negative.    Physical Exam Updated Vital Signs BP 118/76 (BP Location:  Left Arm)   Pulse 99   Temp 98.5 F (36.9 C) (Oral)   Resp 18   Ht 5\' 1"  (1.549 m)   Wt 176 lb (79.8 kg)   SpO2 100%   BMI 33.25 kg/m   Physical Exam  Constitutional: She appears well-developed and well-nourished. No distress.  HENT:  Head: Normocephalic.  Eyes: Conjunctivae are normal.  Neck: Neck supple.  Cardiovascular: Normal rate and regular rhythm.   Pulmonary/Chest: Effort normal.  Abdominal: Soft. There is tenderness (RLQ).  Musculoskeletal: Normal range of motion.  Neurological: She is alert.  Skin: Skin is warm and dry.   Psychiatric: She has a normal mood and affect.  Nursing note and vitals reviewed.    ED Treatments / Results  DIAGNOSTIC STUDIES: Oxygen Saturation is 100% on RA, normal by my interpretation.    COORDINATION OF CARE: 12:15 AM- Pt advised of plan for treatment and pt agrees.  Labs (all labs ordered are listed, but only abnormal results are displayed) Labs Reviewed  URINALYSIS, ROUTINE W REFLEX MICROSCOPIC - Abnormal; Notable for the following:       Result Value   APPearance HAZY (*)    Ketones, ur 20 (*)    Leukocytes, UA MODERATE (*)    Bacteria, UA RARE (*)    Squamous Epithelial / LPF 6-30 (*)    All other components within normal limits  URINE CULTURE  CBC WITH DIFFERENTIAL/PLATELET  COMPREHENSIVE METABOLIC PANEL  PREGNANCY, URINE    EKG  EKG Interpretation None       Radiology US Transvaginal Non-ob  Result Date: 02/07/2017 CLINICAL DATA:  Right lower quadrant pain for 2 days. EXAM: TRANSABDOMINAL AND TRANSVAGINAL ULTRASOUND OF PELVIS DOPPLER ULTRASOUND OF OVARIES TECHNIQUE: Both transabdominal and transvaginal ultrasound examinations of the pelvis were performed. Transabdominal technique was performed for global imaging of the pelvis including uterus, ovaries, adnexal regions, and pelvic cul-de-sac. It was necessary to proceed with endovaginal exam following the transabdominal exam to visualize the right and left ovary. Color and duplex Doppler ultrasound was utilized to evaluate blood flow to the ovaries. COMPARISON:  CT abdomen/ pelvis earlier this day FINDINGS: Uterus Measurements: 7.3 x 2.9 x 4.2 cm. No fibroids or other mass visualized. Endometrium Thickness: 3.5 mm.  No focal abnormality visualized. Right ovary Measurements: 2.9 x 1.5 x 1.3 cm. Normal appearance/no adnexal mass. Normal blood flow. Left ovary Measurements: 2.2 x 1.7 x 1.1 cm. Normal appearance/no adnexal mass. Normal blood flow. Pulsed Doppler evaluation of both ovaries demonstrates normal  low-resistance arterial and venous waveforms. Other findings No abnormal free fluid. IMPRESSION: Normal pelvic ultrasound with Doppler. Electronically Signed   By: Jeb Levering M.D.   On: 02/07/2017 04:29   US Pelvis Complete  Result Date: 02/07/2017 CLINICAL DATA:  Right lower quadrant pain for 2 days. EXAM: TRANSABDOMINAL AND TRANSVAGINAL ULTRASOUND OF PELVIS DOPPLER ULTRASOUND OF OVARIES TECHNIQUE: Both transabdominal and transvaginal ultrasound examinations of the pelvis were performed. Transabdominal technique was performed for global imaging of the pelvis including uterus, ovaries, adnexal regions, and pelvic cul-de-sac. It was necessary to proceed with endovaginal exam following the transabdominal exam to visualize the right and left ovary. Color and duplex Doppler ultrasound was utilized to evaluate blood flow to the ovaries. COMPARISON:  CT abdomen/ pelvis earlier this day FINDINGS: Uterus Measurements: 7.3 x 2.9 x 4.2 cm. No fibroids or other mass visualized. Endometrium Thickness: 3.5 mm.  No focal abnormality visualized. Right ovary Measurements: 2.9 x 1.5 x 1.3 cm. Normal appearance/no adnexal mass. Normal  blood flow. Left ovary Measurements: 2.2 x 1.7 x 1.1 cm. Normal appearance/no adnexal mass. Normal blood flow. Pulsed Doppler evaluation of both ovaries demonstrates normal low-resistance arterial and venous waveforms. Other findings No abnormal free fluid. IMPRESSION: Normal pelvic ultrasound with Doppler. Electronically Signed   By: Jeb Levering M.D.   On: 02/07/2017 04:29   Ct Abdomen Pelvis W Contrast  Result Date: 02/07/2017 CLINICAL DATA:  Right lower quadrant pain EXAM: CT ABDOMEN AND PELVIS WITH CONTRAST TECHNIQUE: Multidetector CT imaging of the abdomen and pelvis was performed using the standard protocol following bolus administration of intravenous contrast. CONTRAST:  175mL ISOVUE-300 IOPAMIDOL (ISOVUE-300) INJECTION 61% COMPARISON:  Pelvic ultrasound 09/22/2015 FINDINGS:  Lower chest: No acute abnormality. Hepatobiliary: No focal liver abnormality is seen. No gallstones, gallbladder wall thickening, or biliary dilatation. Pancreas: Unremarkable. No pancreatic ductal dilatation or surrounding inflammatory changes. Spleen: Normal in size without focal abnormality. Adrenals/Urinary Tract: Adrenal glands are unremarkable. Kidneys are normal, without renal calculi, focal lesion, or hydronephrosis. Bladder is unremarkable. Stomach/Bowel: Stomach is within normal limits. Appendix appears normal. No evidence of bowel wall thickening, distention, or inflammatory changes. Vascular/Lymphatic: No significant vascular findings are present. No enlarged abdominal or pelvic lymph nodes. Reproductive: Uterus and bilateral adnexa are unremarkable. Other: No abdominal wall hernia or abnormality. No abdominopelvic ascites. Musculoskeletal: No acute or significant osseous findings. IMPRESSION: Unremarkable CT of the abdomen and pelvis. Normal-appearing appendix and right ovary. No acute bowel inflammation or obstruction. Electronically Signed   By: Angelita Royalty M.D.   On: 02/07/2017 02:22   [place] Art/ven Flow Abd Pelv Doppler  Result Date: 02/07/2017 CLINICAL DATA:  Right lower quadrant pain for 2 days. EXAM: TRANSABDOMINAL AND TRANSVAGINAL ULTRASOUND OF PELVIS DOPPLER ULTRASOUND OF OVARIES TECHNIQUE: Both transabdominal and transvaginal ultrasound examinations of the pelvis were performed. Transabdominal technique was performed for global imaging of the pelvis including uterus, ovaries, adnexal regions, and pelvic cul-de-sac. It was necessary to proceed with endovaginal exam following the transabdominal exam to visualize the right and left ovary. Color and duplex Doppler ultrasound was utilized to evaluate blood flow to the ovaries. COMPARISON:  CT abdomen/ pelvis earlier this day FINDINGS: Uterus Measurements: 7.3 x 2.9 x 4.2 cm. No fibroids or other mass visualized. Endometrium Thickness: 3.5 mm.  No  focal abnormality visualized. Right ovary Measurements: 2.9 x 1.5 x 1.3 cm. Normal appearance/no adnexal mass. Normal blood flow. Left ovary Measurements: 2.2 x 1.7 x 1.1 cm. Normal appearance/no adnexal mass. Normal blood flow. Pulsed Doppler evaluation of both ovaries demonstrates normal low-resistance arterial and venous waveforms. Other findings No abnormal free fluid. IMPRESSION: Normal pelvic ultrasound with Doppler. Electronically Signed   By: Jeb Levering M.D.   On: 02/07/2017 04:29    Procedures Procedures (including critical care time)  Medications Ordered in ED Medications  iopamidol (ISOVUE-300) 61 % injection (not administered)  morphine 4 MG/ML injection 4 mg (4 mg Intravenous Given 02/07/17 0030)  ondansetron (ZOFRAN) injection 4 mg (4 mg Intravenous Given 02/07/17 0030)  iopamidol (ISOVUE-300) 61 % injection 100 mL (100 mLs Intravenous Contrast Given 02/07/17 0152)     Initial Impression / Assessment and Plan / ED Course  I have reviewed the triage vital signs and the nursing notes.  Pertinent labs & imaging results that were available during my care of the patient were reviewed by me and considered in my medical decision making (see chart for details).      Final Clinical Impressions(s) / ED Diagnoses   Final diagnoses:  RLQ abdominal  pain  Urinary tract infection without hematuria, site unspecified    Labs: Urinalysis, urine culture urine pregnancy, CMP, CBC  Imaging: CT abdomen pelvis, ultrasound pelvis  Consults:  Therapeutics: Morphine  Discharge Meds: Ultram Assessment/Plan: 21 year old female presents today with complaints of abdominal pain. Patient's workup shows no signs of systemic illness. She has a negative CT, negative ultrasound ruling out torsion. Patient denies any vaginal discharge or bleeding. Patient's urine questionable for urinary tract infection with moderate leukocytes 60-30 WBCs. Given patient's location of symptoms, reassuring workup  with CT and ultrasound this presentation could likely be urinary tract infection. This is less likely musculoskeletal given patient's recent vomiting. Patient denies any dysuria today, but reports earlier in the week she was having pain with urination. Patient will be started on antibiotics, urine culture sent. She will follow up with primary care for reevaluation, return immediately if any new or worsening signs or symptoms present. Patient verbalized understanding and agreement to today's plan had no further questions or concerns.   New Prescriptions New Prescriptions   SULFAMETHOXAZOLE-TRIMETHOPRIM (BACTRIM DS,SEPTRA DS) 800-160 MG TABLET    Take 1 tablet by mouth 2 (two) times daily.  I personally performed the services described in this documentation, which was scribed in my presence. The recorded information has been reviewed and is accurate.    Okey Regal, PA-C 02/07/17 0510    Ward, Delice Bison, DO 02/07/17 0630

## 2017-02-09 LAB — URINE CULTURE

## 2017-05-18 ENCOUNTER — Encounter (HOSPITAL_COMMUNITY): Payer: Self-pay | Admitting: Emergency Medicine

## 2017-05-18 ENCOUNTER — Emergency Department (HOSPITAL_COMMUNITY): Payer: Worker's Compensation

## 2017-05-18 ENCOUNTER — Emergency Department (HOSPITAL_COMMUNITY)
Admission: EM | Admit: 2017-05-18 | Discharge: 2017-05-19 | Disposition: A | Discharge status: Home / Self Care | Payer: Worker's Compensation | Attending: Emergency Medicine | Admitting: Emergency Medicine

## 2017-05-18 DIAGNOSIS — S93401A Sprain of unspecified ligament of right ankle, initial encounter: Secondary | ICD-10-CM | POA: Diagnosis not present

## 2017-05-18 DIAGNOSIS — Z79899 Other long term (current) drug therapy: Secondary | ICD-10-CM | POA: Insufficient documentation

## 2017-05-18 DIAGNOSIS — Y999 Unspecified external cause status: Secondary | ICD-10-CM | POA: Insufficient documentation

## 2017-05-18 DIAGNOSIS — Y9389 Activity, other specified: Secondary | ICD-10-CM | POA: Insufficient documentation

## 2017-05-18 DIAGNOSIS — Y929 Unspecified place or not applicable: Secondary | ICD-10-CM | POA: Insufficient documentation

## 2017-05-18 DIAGNOSIS — X501XXA Overexertion from prolonged static or awkward postures, initial encounter: Secondary | ICD-10-CM | POA: Diagnosis not present

## 2017-05-18 DIAGNOSIS — S99911A Unspecified injury of right ankle, initial encounter: Secondary | ICD-10-CM | POA: Diagnosis present

## 2017-05-18 MED ORDER — MORPHINE SULFATE (PF) 4 MG/ML IV SOLN
4.0000 mg | Freq: Once | INTRAVENOUS | Status: AC
  Administered 2017-05-18: 4 mg via INTRAVENOUS
  Filled 2017-05-18: qty 1

## 2017-05-18 MED ORDER — ONDANSETRON HCL 4 MG/2ML IJ SOLN
4.0000 mg | Freq: Once | INTRAMUSCULAR | Status: AC
  Administered 2017-05-18: 4 mg via INTRAVENOUS
  Filled 2017-05-18: qty 2

## 2017-05-18 NOTE — ED Notes (Signed)
Bed: WA09 Expected date:  Expected time:  Means of arrival:  Comments: 21 yr old ankle injury

## 2017-05-18 NOTE — ED Triage Notes (Signed)
Pt comes to ed, via ems, ankle injury right side, edema and pressure. Good pedal pluses. V/s on arrival  126/84, hr 100, rr 18, spo2 98.  20 ig, Iv in r hand. 150 mcg fentanyl given.

## 2017-05-18 NOTE — ED Provider Notes (Signed)
TIME SEEN: 11:17 PM    CHIEF COMPLAINT: right ankle injury  HPI: patient is a 21 year old female with no significant past medical history who presents to the emergency department with a right ankle injury. States that she twisted her ankle when standing up from a seated position of proximal and our half prior to arrival. Has swelling and pain to the lateral right ankle and lateral right foot. She states she did fall to the ground but she caught herself with both hands. Did not hit her head.  Has some tingling in her right toes but no other numbness or focal weakness. Pain worse with movement of the ankle.    ROS: See HPI Constitutional: no fever  Eyes: no drainage  ENT: no runny nose   Cardiovascular:  no chest pain  Resp: no SOB  GI: no vomiting GU: no dysuria Integumentary: no rash  Allergy: no hives  Musculoskeletal: no leg swelling  Neurological: no slurred speech ROS otherwise negative  PAST MEDICAL HISTORY/PAST SURGICAL HISTORY:  Past Medical History:  Diagnosis Date  . Depression   . UTI (lower urinary tract infection)   . Vaginal delivery 05/2014    MEDICATIONS:  Prior to Admission medications   Medication Sig Start Date End Date Taking? Authorizing Provider  acetaminophen (TYLENOL) 500 MG tablet Take 500 mg by mouth every 6 (six) hours as needed for moderate pain.   Yes [provider]  ibuprofen (ADVIL,MOTRIN) 200 MG tablet Take 400 mg by mouth every 6 (six) hours as needed for moderate pain.   Yes [provider]  LORYNA 3-0.02 MG tablet TK 1 T PO QD 05/01/17  Yes [provider]  traMADol (ULTRAM) 50 MG tablet Take 1 tablet (50 mg total) by mouth every 6 (six) hours as needed. Patient not taking: Reported on 05/18/2017 02/07/17   Okey Regal, PA-C    ALLERGIES:  No Known Allergies  SOCIAL HISTORY:  Social History  Substance Use Topics  . Smoking status: Never Smoker  . Smokeless tobacco: Never Used  . Alcohol use No     Comment:  occ    FAMILY HISTORY: Family History  Problem Relation Age of Onset  . Cancer Father        melanoma  . Stroke Paternal Aunt   . Hypertension Paternal Grandmother   . Cancer Paternal Grandmother     EXAM: BP 131/77 (BP Location: Right Arm)   Pulse (!) 102   Temp 98.1 F (36.7 C) (Oral)   Resp 18   SpO2 99%  CONSTITUTIONAL: Alert and oriented and responds appropriately to questions. Well-appearing; well-nourished HEAD: Normocephalic, atraumatic EYES: Conjunctivae clear, pupils appear equal, EOMI ENT: normal nose; moist mucous membranes NECK: Supple, no meningismus, no nuchal rigidity, no LAD  CARD: RRR; S1 and S2 appreciated; no murmurs, no clicks, no rubs, no gallops RESP: Normal chest excursion without splinting or tachypnea; breath sounds clear and equal bilaterally; no wheezes, no rhonchi, no rales, no hypoxia or respiratory distress, speaking full sentences ABD/GI: Normal bowel sounds; non-distended; soft, non-tender, no rebound, no guarding, no peritoneal signs, no hepatosplenomegaly BACK:  The back appears normal and is non-tender to palpation, there is no CVA tenderness EXT: patient has swelling noted to the lateral right ankle and lateral foot with mild ecchymosis and tenderness on exam. Unable to test for ligamentous laxity given patient's pain. She has 2+ DP pulses bilaterally. No pain over the proximal fibular head on the right. Normal range of motion in the knee and hip  on the side and she is able to wiggle her right toes. She reports normal sensation diffusely throughout the right leg.  Otherwise Normal ROM in all joints; otherwise extremities are non-tender to palpation; no edema; normal capillary refill; no cyanosis, no calf tenderness or swelling, no sign of compartment syndrome    SKIN: Normal color for age and race; warm; no rash NEURO: Moves all extremities equally PSYCH: The patient's mood and manner are appropriate. Grooming and personal hygiene are  appropriate.  MEDICAL DECISION MAKING: patient here with mechanical fall with right foot and ankle injury. We'll obtain x-rays. We'll give morphine for pain. She did receive fentanyl with EMS. No other injury on exam.  ED PROGRESS: x-ray show no acute fracture. Suspect sprain. We will place her in a walking boot and provided crutches. Have provided her with a work note and will discharge her short course of Vicodin for pain control. Discussed with patient that if symptoms are not improving or have recommended close outpatient primary care follow-up. We have given her orthopedic follow-up as well. She is still neurovascularly distally.   At this time, I do not feel there is any life-threatening condition present. I have reviewed and discussed all results (EKG, imaging, lab, urine as appropriate) and exam findings with patient/family. I have reviewed nursing notes and appropriate previous records.  I feel the patient is safe to be discharged home without further emergent workup and can continue workup as an outpatient as needed. Discussed usual and customary return precautions. Patient/family verbalize understanding and are comfortable with this plan.  Outpatient follow-up has been provided if needed. All questions have been answered.      Ward, Delice Bison, DO 05/19/17 [phone]

## 2017-05-19 MED ORDER — ONDANSETRON 4 MG PO TBDP: 4.0000 mg | ORAL_TABLET | Freq: Three times a day (TID) | ORAL | 0 refills | Status: DC | PRN

## 2017-05-19 MED ORDER — HYDROCODONE-ACETAMINOPHEN 5-325 MG PO TABS: 1.0000 | ORAL_TABLET | Freq: Four times a day (QID) | ORAL | 0 refills | Status: DC | PRN

## 2017-05-19 MED ORDER — IBUPROFEN 800 MG PO TABS: 800.0000 mg | ORAL_TABLET | Freq: Three times a day (TID) | ORAL | 0 refills | Status: DC | PRN

## 2017-05-19 NOTE — Discharge Instructions (Signed)
I recommend that you stay nonweightbearing for the next 2 days and then you may weight-bear as tolerated. I recommended you wear the boot for the next week and if your symptoms are not improving you will need to follow-up with a primary care physician.   To find a primary care or specialty doctor please call [phone] or [phone] to access "[facility]."  You may also go on the [facility] website at [email or web address]  There are also multiple [facility], [facility], [facility] and [facility] practices throughout the [place] that are frequently accepting new patients. You may find a clinic that is close to your home and contact them.  [place] [id number] [place]  [place] [address] [address] [place] [phone]

## 2017-06-30 ENCOUNTER — Emergency Department (HOSPITAL_COMMUNITY)
Admission: EM | Admit: 2017-06-30 | Discharge: 2017-07-01 | Discharge status: Against Medical Advice | Payer: Managed Care, Other (non HMO) | Attending: Emergency Medicine | Admitting: Emergency Medicine

## 2017-06-30 DIAGNOSIS — Z5321 Procedure and treatment not carried out due to patient leaving prior to being seen by health care provider: Secondary | ICD-10-CM | POA: Insufficient documentation

## 2017-07-01 ENCOUNTER — Encounter (HOSPITAL_COMMUNITY): Payer: Self-pay

## 2017-07-01 ENCOUNTER — Emergency Department (HOSPITAL_COMMUNITY)
Admission: EM | Admit: 2017-07-01 | Discharge: 2017-07-01 | Disposition: A | Discharge status: Home / Self Care | Payer: Worker's Compensation | Attending: Emergency Medicine | Admitting: Emergency Medicine

## 2017-07-01 DIAGNOSIS — Y999 Unspecified external cause status: Secondary | ICD-10-CM | POA: Insufficient documentation

## 2017-07-01 DIAGNOSIS — Y939 Activity, unspecified: Secondary | ICD-10-CM | POA: Diagnosis not present

## 2017-07-01 DIAGNOSIS — Y33XXXA Other specified events, undetermined intent, initial encounter: Secondary | ICD-10-CM | POA: Insufficient documentation

## 2017-07-01 DIAGNOSIS — S99911A Unspecified injury of right ankle, initial encounter: Secondary | ICD-10-CM | POA: Insufficient documentation

## 2017-07-01 DIAGNOSIS — Y929 Unspecified place or not applicable: Secondary | ICD-10-CM | POA: Insufficient documentation

## 2017-07-01 DIAGNOSIS — Z79899 Other long term (current) drug therapy: Secondary | ICD-10-CM | POA: Insufficient documentation

## 2017-07-01 MED ORDER — IBUPROFEN 800 MG PO TABS: 800.0000 mg | ORAL_TABLET | Freq: Three times a day (TID) | ORAL | 0 refills | Status: DC

## 2017-07-01 MED ORDER — CYCLOBENZAPRINE HCL 10 MG PO TABS: 5.0000 mg | ORAL_TABLET | Freq: Two times a day (BID) | ORAL | 0 refills | Status: DC | PRN

## 2017-07-01 NOTE — ED Provider Notes (Signed)
[facility] DEPT Provider Note   CSN: [id number] Arrival date & time: 07/01/17  2202  History   Chief Complaint Chief Complaint  Patient presents with  . recheck right ankle    HPI Kathleen Lloyd is a 21 y.o. female.  HPI   Pt has a hx of Depression, UTIs, comes to the ER for further evaluation of an ankle injury sustained over a month ago. The injury took place while at work and is a workers com case. She was seen in the ER, referred to Dr. Percell Miller, has seen Dr. Percell Miller who has concern for tendon/ligament injury and wants to get an MRI and thinks she may potentially need surgery.  Workers comp will not clear her to see Dr. Percell Miller or have the MRI performed. Therefore she did not know what else to do but came to the ER. She has not been wearing her Cam Walker boot because of the swelling and now it has swollen more and she doesn't feel she can put the boot on. Swelling to about her bilateral ankle. No redness, fevers, numbness or weakness.  Past Medical History:  Diagnosis Date  . Depression   . UTI (lower urinary tract infection)   . Vaginal delivery 05/2014    Patient Active Problem List   Diagnosis Date Noted  . Chest pain 02/06/2016  . Pelvic pain in female 09/22/2015  . Vaginal delivery 06/11/2014  . Cellulitis of axilla, right 03/15/2014    Past Surgical History:  Procedure Laterality Date  . MASS EXCISION Right 07/25/2014   Procedure: REMOVAL OF RIGHT AXILLA MASS;  Surgeon: Michael Boston, MD;  Location: [facility];  Service: General;  Laterality: Right;    OB History    Gravida Para Term Preterm AB Living   1 1 1     1    SAB TAB Ectopic Multiple Live Births           1       Home Medications    Prior to Admission medications   Medication Sig Start Date End Date Taking? Authorizing Provider  cyclobenzaprine (FLEXERIL) 10 MG tablet Take 0.5-1 tablets (5-10 mg total) by mouth 2 (two) times daily as needed. 07/01/17   Delos Haring, PA-C  HYDROcodone-acetaminophen  (NORCO/VICODIN) 5-325 MG tablet Take 1-2 tablets by mouth every 6 (six) hours as needed. 05/19/17   Ward, Delice Bison, DO  ibuprofen (ADVIL,MOTRIN) 800 MG tablet Take 1 tablet (800 mg total) by mouth every 8 (eight) hours as needed for mild pain. 05/19/17   Ward, Delice Bison, DO  ibuprofen (ADVIL,MOTRIN) 800 MG tablet Take 1 tablet (800 mg total) by mouth 3 (three) times daily. 07/01/17   Carlota Raspberry, Shaniah Baltes, PA-C  LORYNA 3-0.02 MG tablet TK 1 T PO QD 05/01/17   [provider]  ondansetron (ZOFRAN ODT) 4 MG disintegrating tablet Take 1 tablet (4 mg total) by mouth every 8 (eight) hours as needed for nausea or vomiting. 05/19/17   Ward, Delice Bison, DO    Family History Family History  Problem Relation Age of Onset  . Cancer Father        melanoma  . Stroke Paternal Aunt   . Hypertension Paternal Grandmother   . Cancer Paternal Grandmother     Social History Social History  Substance Use Topics  . Smoking status: Never Smoker  . Smokeless tobacco: Never Used  . Alcohol use No     Comment: occ     Allergies   Patient has no known allergies.  Review of Systems Review of Systems  Negative ROS aside from pertinent positives and negatives as listed in HPI  Physical Exam Updated Vital Signs BP 131/82 (BP Location: Right Arm)   Pulse (!) 106   Temp 98.8 F (37.1 C) (Oral)   Resp 17   SpO2 99%   Physical Exam  Constitutional: She appears well-developed and well-nourished. No distress.  HENT:  Head: Normocephalic and atraumatic.  Eyes: Pupils are equal, round, and reactive to light.  Neck: Normal range of motion. Neck supple.  Cardiovascular: Normal rate and regular rhythm.   Pulmonary/Chest: Effort normal.  Abdominal: Soft.  Musculoskeletal:       Right ankle: She exhibits decreased range of motion and swelling. She exhibits no ecchymosis, no deformity, no laceration and normal pulse. Tenderness. Lateral malleolus and medial malleolus tenderness found. Achilles tendon  normal.  Neurological: She is alert.  Skin: Skin is warm and dry.  Nursing note and vitals reviewed.    ED Treatments / Results  Labs (all labs ordered are listed, but only abnormal results are displayed) Labs Reviewed - No data to display  EKG  EKG Interpretation None       Radiology No results found.  Procedures Procedures (including critical care time)  Medications Ordered in ED Medications - No data to display   Initial Impression / Assessment and Plan / ED Course  I have reviewed the triage vital signs and the nursing notes.  Pertinent labs & imaging results that were available during my care of the patient were reviewed by me and considered in my medical decision making (see chart for details).     unfortunately there is not much that can be done from the ER, as this is an ongoing injury I am hesitant to prescribe narcotics but will write for Flexeril and Motrin. Her swelling is not significant, she did not bring her Cam Walker boot but I am surprised she doesn't feel like it will fit. We will try to fit her with a larger boot here. I have advised her to be non weight bearing until further evaluation.   I considered if her symptoms were related to DVT, at this time- her pain and swelling are localized to the bilateral malleolus and does not extend up the calf-- discussed red flag/ return symptoms.  Blood pressure 131/82, pulse (!) 106, temperature 98.8 F (37.1 C), temperature source Oral, resp. rate 17, SpO2 99 %.  Kathleen Lloyd has been evaluated today in the emergency department. The appropriate screening and testing was been performed and I believe the patient to be medically stable for discharge.   Return signs and symptoms have been discussed with the patient and/or caregivers and they have voiced their understanding. The patient has agreed to follow-up with their primary care provider or the referred specialist.      Final Clinical Impressions(s) / ED  Diagnoses   Final diagnoses:  Injury of right ankle, initial encounter    New Prescriptions New Prescriptions   CYCLOBENZAPRINE (FLEXERIL) 10 MG TABLET    Take 0.5-1 tablets (5-10 mg total) by mouth 2 (two) times daily as needed.   IBUPROFEN (ADVIL,MOTRIN) 800 MG TABLET    Take 1 tablet (800 mg total) by mouth 3 (three) times daily.     Delos Haring, PA-C 07/01/17 1214    Milton Ferguson, MD 07/01/17 [phone]

## 2017-07-01 NOTE — ED Triage Notes (Signed)
Patient complains of ongoing right ankle pain since fall of 8/27. Has ben seen by ortho for same and states increased swelling and pain with ambulation

## 2017-07-02 ENCOUNTER — Other Ambulatory Visit: Payer: Self-pay | Admitting: Orthopedic Surgery

## 2017-07-02 DIAGNOSIS — M25571 Pain in right ankle and joints of right foot: Secondary | ICD-10-CM

## 2017-07-11 ENCOUNTER — Ambulatory Visit
Admission: RE | Admit: 2017-07-11 | Discharge: 2017-07-11 | Disposition: A | Discharge status: Home / Self Care | Payer: Worker's Compensation | Source: Ambulatory Visit | Attending: Orthopedic Surgery | Admitting: Orthopedic Surgery

## 2017-07-11 DIAGNOSIS — M25571 Pain in right ankle and joints of right foot: Secondary | ICD-10-CM

## 2017-07-11 IMAGING — MR MR ANKLE*R* W/O CM
4 of 5 series · 23 of 40 positions shown · non-contrast
Comparison: None.

CLINICAL DATA: Right ankle spurring [DATE]. When she had legs
elevated and left foot went numb. She stood and tried to walk on
foot and ankle turned in medially and she fell forward.

EXAM:
MRI OF THE RIGHT ANKLE WITHOUT CONTRAST
TECHNIQUE: Multiplanar, multisequence MR imaging of the ankle was performed. No
intravenous contrast was administered.

[Series 3: PD fat-sat · axial · 4.0mm · 0.31mm/px · z∈[-77,+43]mm · 8 of 27 slices shown]
[im 1/27]
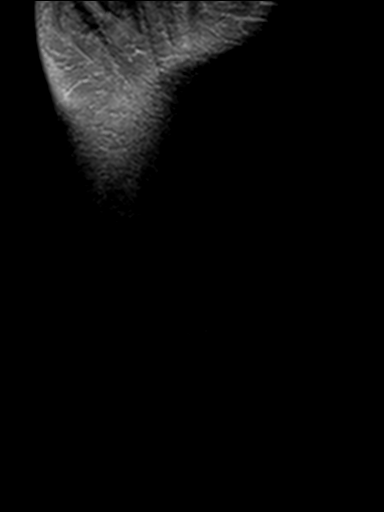
[im 4/27]
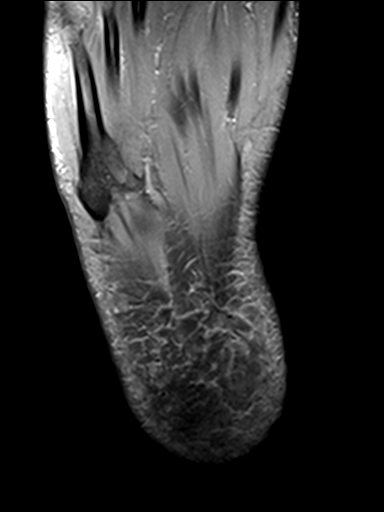
[im 8/27]
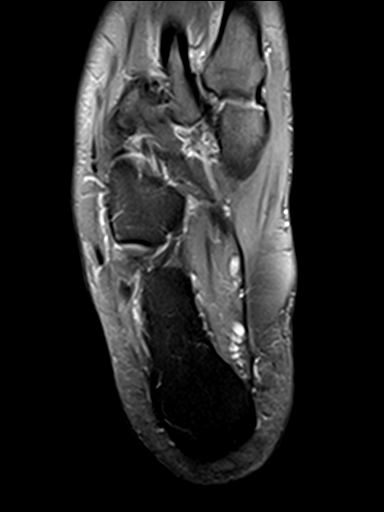
[im 12/27]
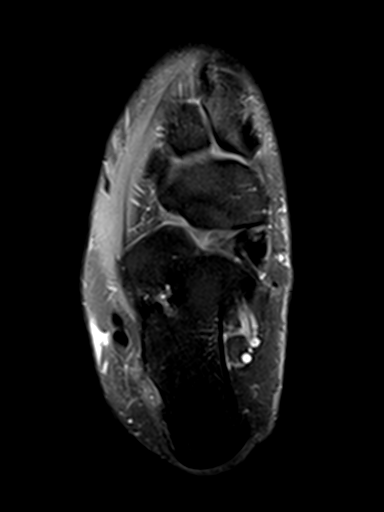
[im 15/27]
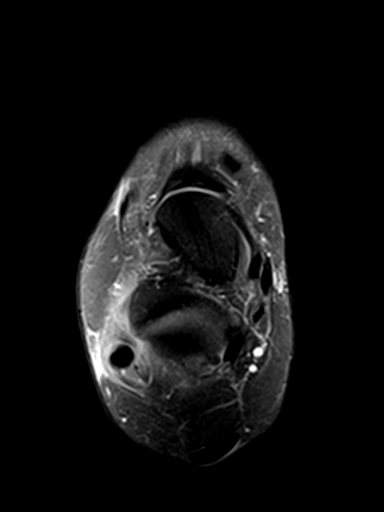
[im 19/27]
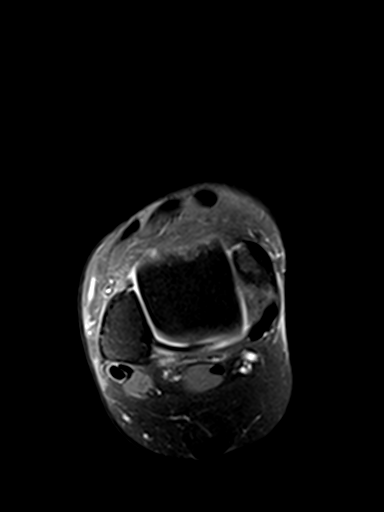
[im 23/27]
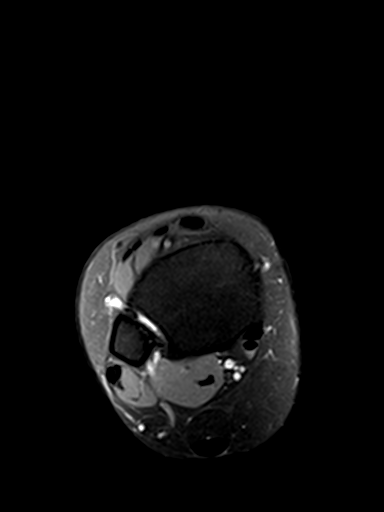
[im 27/27]
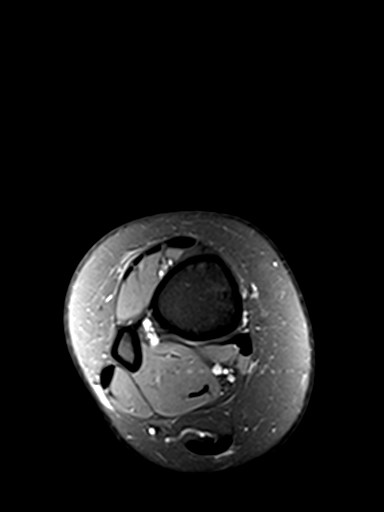

[Series 4: T2 fat-sat · axial · 4.0mm · 0.31mm/px · z∈[-77,+43]mm · 8 of 27 slices shown (1 of 3)]
[im 1/27]
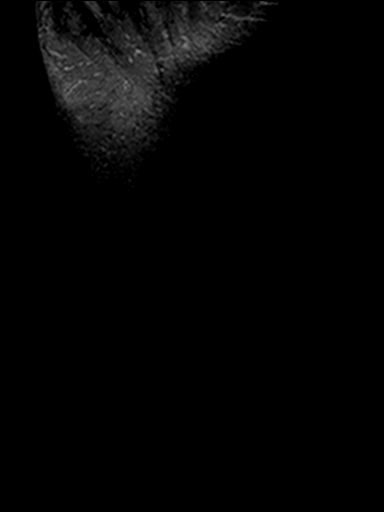
[im 4/27]
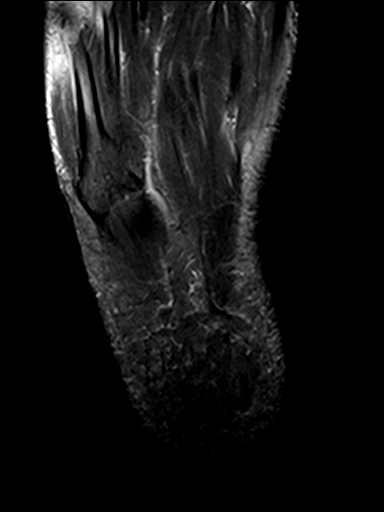
[im 8/27]
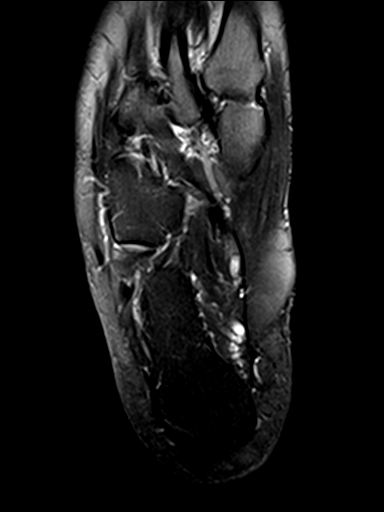
[im 12/27]
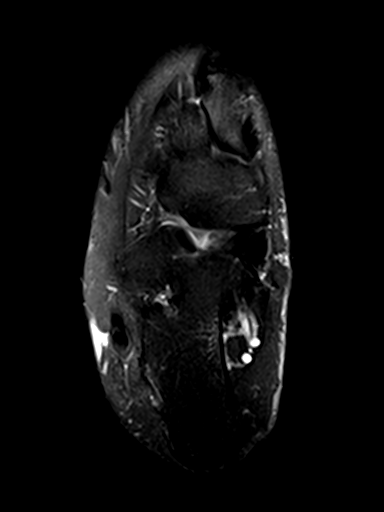
[im 15/27]
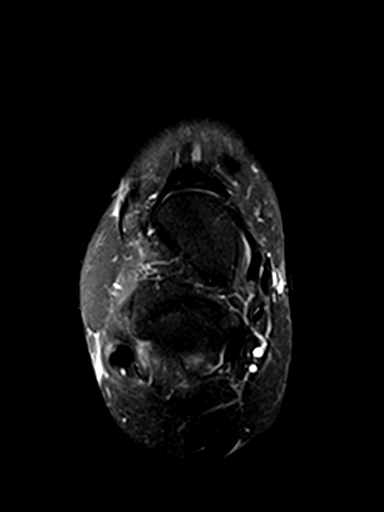
[im 19/27]
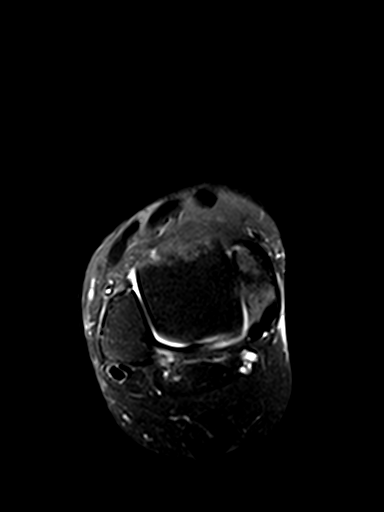
[im 23/27]
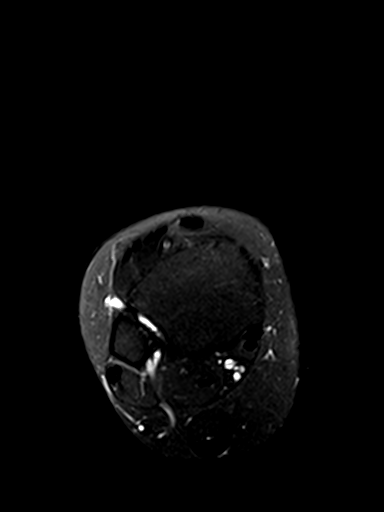
[im 27/27]
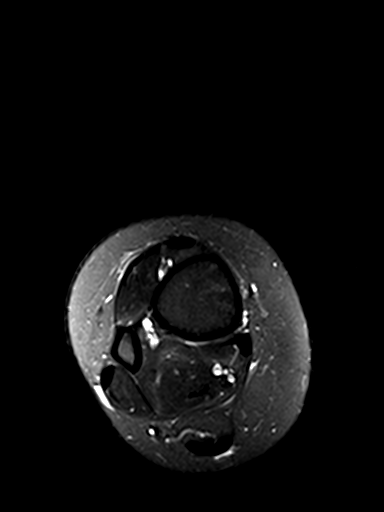

[Series 6: T2 fat-sat · sagittal · 3.0mm · 0.31mm/px · 4 of 21 slices shown (2 of 3)]
[im 1/21]
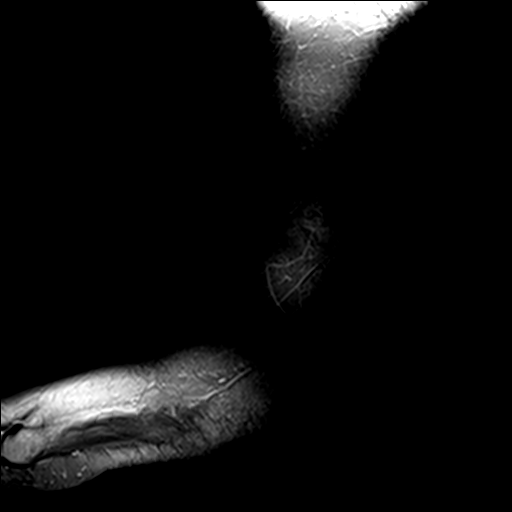
[im 4/21]
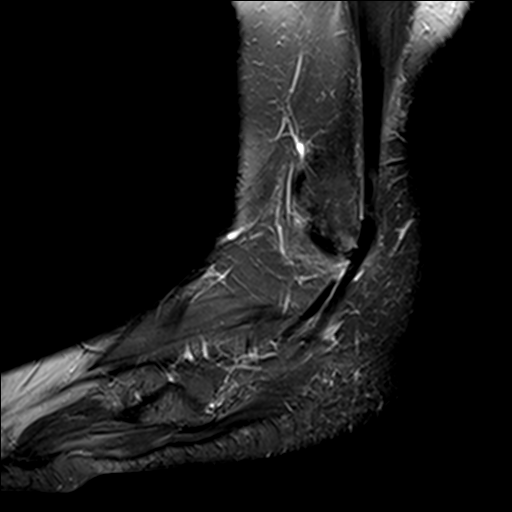
[im 11/21]
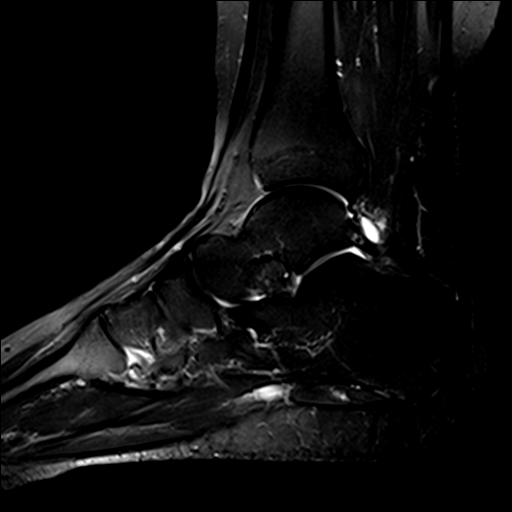
[im 17/21]
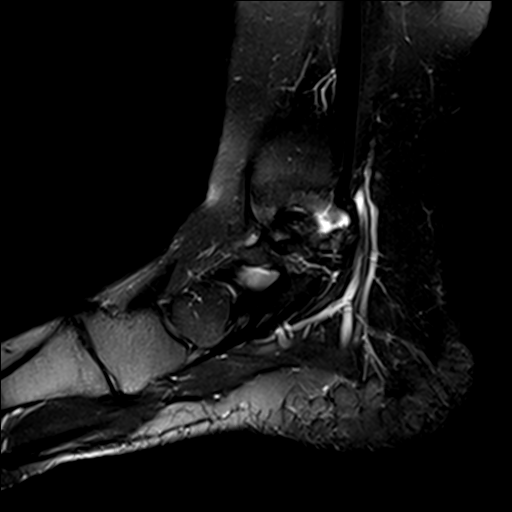

[Series 7: T2 fat-sat · coronal · 4.0mm · 0.31mm/px · 3 of 30 slices shown (3 of 3)]
[im 4/30]
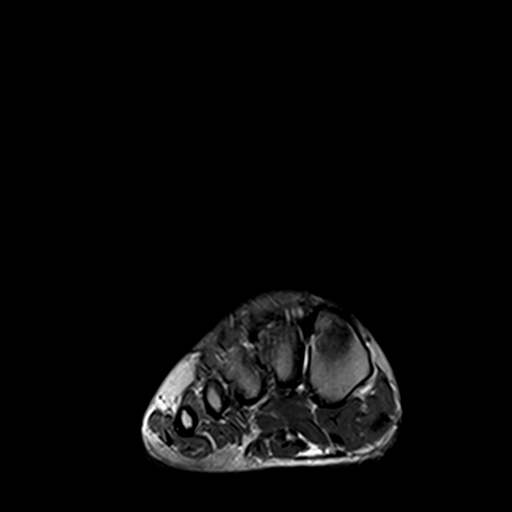
[im 17/30]
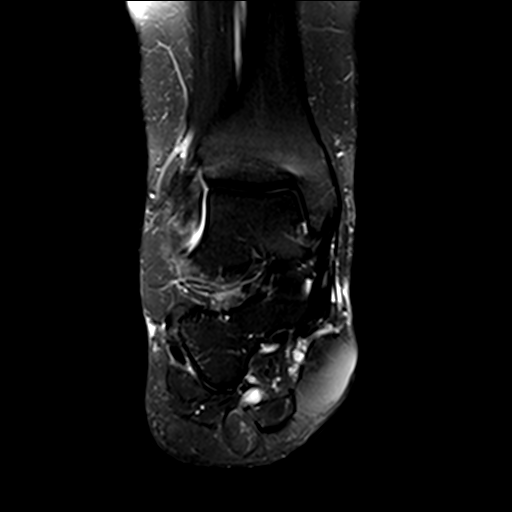
[im 26/30]
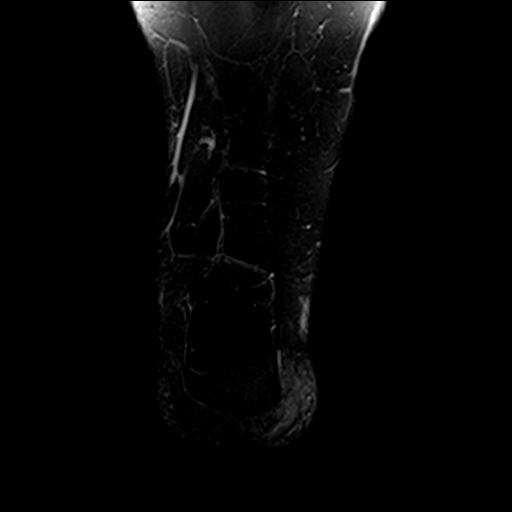

[23 of 40 positions shown; findings below may reference images not displayed]

FINDINGS: TENDONS

Peroneal: Peroneal longus tendon intact. Peroneal brevis intact.

Posteromedial: Posterior tibial tendon intact. Flexor hallucis
longus tendon intact. Flexor digitorum longus tendon intact.

Anterior: Tibialis anterior tendon intact. Extensor hallucis longus
tendon intact Extensor digitorum longus tendon intact.

Achilles:  Intact.

Plantar Fascia: Intact.

LIGAMENTS

Lateral: Thickening of the anterior talofibular ligament consistent
with injury without complete tear. Calcaneofibular ligament intact.
Posterior talofibular ligament intact. Anterior and posterior
tibiofibular ligaments intact.

Medial: Deltoid ligament intact. Spring ligament intact.

CARTILAGE

Ankle Joint: No joint effusion. Normal ankle mortise. No chondral
defect.

Subtalar Joints/Sinus Tarsi: Normal subtalar joints. No subtalar
joint effusion. Normal sinus tarsi.

Bones: Mild marrow edema in the medial malleolus. No acute fracture
or dislocation. No aggressive osseous lesion. No periosteal reaction
or bone destruction.

Soft Tissue: No fluid collection or hematoma.
IMPRESSION: 1. Thickening of the anterior talofibular ligament consistent with
injury without complete tear.
2. Mild marrow edema in the medial malleolus without a fracture.
This likely reflects a contusion from an ankle sprain.

## 2017-08-21 ENCOUNTER — Ambulatory Visit: Payer: Managed Care, Other (non HMO) | Admitting: Physician Assistant

## 2017-08-21 ENCOUNTER — Encounter: Payer: Self-pay | Admitting: Physician Assistant

## 2017-08-21 VITALS — BP 122/78 | HR 109 | Temp 98.1°F | Resp 17 | Ht 62.5 in | Wt 190.0 lb

## 2017-08-21 DIAGNOSIS — R41840 Attention and concentration deficit: Secondary | ICD-10-CM | POA: Insufficient documentation

## 2017-08-21 DIAGNOSIS — Z8659 Personal history of other mental and behavioral disorders: Secondary | ICD-10-CM | POA: Insufficient documentation

## 2017-08-21 DIAGNOSIS — R87619 Unspecified abnormal cytological findings in specimens from cervix uteri: Secondary | ICD-10-CM | POA: Insufficient documentation

## 2017-08-21 MED ORDER — METHYLPHENIDATE HCL ER 18 MG PO TB24: 18.0000 mg | ORAL_TABLET | Freq: Every morning | ORAL | 0 refills | Status: DC

## 2017-08-21 NOTE — Progress Notes (Signed)
   Kathleen Lloyd  MRN: [id number] DOB: 01-07-1996  PCP: Patient, No Pcp Per  Subjective:  Pt is a 21 year old female who presents to clinic for depression. She is here today with her son. She endorses 40 lb weight gain in 9 months. No energy to do anything. She is arguing with her fiance- they are not having intercourse, she is not doing laundry on time. "I will let the laundry just sit there for like a week".  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  Her boss has been on her recently as she is not staying on task and not completing tasks. 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  Does not feel sad - no episodes of crying. She feels very unmotivated and down.   Denies SI or HI.   Has been on Wellbutrin in the past during pregnancy - did not help much "I slept all the time"  Review of Systems  Constitutional: Positive for unexpected weight change.  Neurological: Negative for dizziness, light-headedness and headaches.  Psychiatric/Behavioral: Positive for decreased concentration, dysphoric mood and sleep disturbance. Negative for agitation, self-injury and suicidal ideas. The patient is not nervous/anxious.     Patient Active Problem List   Diagnosis Date Noted  . Chest pain 02/06/2016  . Pelvic pain in female 09/22/2015  . Vaginal delivery 06/11/2014  . Cellulitis of axilla, right 03/15/2014    Current Outpatient Medications on File Prior to Visit  Medication Sig Dispense Refill  . LORYNA 3-0.02 MG tablet TK 1 T PO QD  2   No current facility-administered medications on file prior to visit.     No Known Allergies   Objective:  BP 122/78   Pulse (!) 109   Temp 98.1 F (36.7 C) (Oral)   Resp 17   Ht 5' 2.5" (1.588 m)   Wt 190 lb (86.2 kg)   LMP 08/14/2017 (Approximate)   SpO2 98%   BMI 34.20 kg/m   Physical Exam  Constitutional: She is oriented to person,  place, and time and well-developed, well-nourished, and in no distress. No distress.  Cardiovascular: Normal rate, regular rhythm and normal heart sounds.  Neurological: She is alert and oriented to person, place, and time. GCS score is 15.  Skin: Skin is warm and dry.  Psychiatric: Mood, memory, affect and judgment normal.  Vitals reviewed.   Assessment and Plan :  1. Difficulty concentrating - methylphenidate 18 MG PO CR tablet; Take 1 tablet (18 mg total) by mouth every morning.  Dispense: 30 tablet; Refill: 0 - I do not suspect pt is depressed. She seems unmotivated and unable to stay on task. Plan to treat with Strattera. RTC in 3-4 weeks to check in. She agrees with plan.    Mercer Pod, PA-C  [facility] 08/21/2017 2:33 PM

## 2017-08-21 NOTE — Patient Instructions (Addendum)
Start taking Strattera every morning. Come back and see me in 3-4 weeks to check in.   Thank you for coming in today. I hope you feel we met your needs.  Feel free to call PCP if you have any questions or further requests.  Please consider signing up for MyChart if you do not already have it, as this is a great way to communicate with me.  Best,  Whitney McVey, PA-C   Atomoxetine capsules What is this medicine? ATOMOXETINE (AT oh mox e teen) is used to treat attention deficit/hyperactivity disorder, also known as ADHD. It is not a stimulant like other drugs for ADHD. This drug can improve attention span, concentration, and emotional control. It can also reduce restless or overactive behavior. This medicine may be used for other purposes; ask your health care provider or pharmacist if you have questions. COMMON BRAND NAME(S): Strattera What should I tell my health care provider before I take this medicine? They need to know if you have any of these conditions: -glaucoma -high or low blood pressure -history of stroke -irregular heartbeat or other cardiac disease -liver disease -mania or bipolar disorder -pheochromocytoma -suicidal thoughts -an unusual or allergic reaction to atomoxetine, other medicines, foods, dyes, or preservatives -pregnant or trying to get pregnant -breast-feeding How should I use this medicine? Take this medicine by mouth with a glass of water. Follow the directions on the prescription label. You can take it with or without food. If it upsets your stomach, take it with food. If you have difficulty sleeping and you take more than 1 dose per day, take your last dose before 6 PM. Take your medicine at regular intervals. Do not take it more often than directed. Do not stop taking except on your doctor's advice. A special MedGuide will be given to you by the pharmacist with each prescription and refill. Be sure to read this information carefully each time. Talk to your  pediatrician regarding the use of this medicine in children. While this drug may be prescribed for children as Hari as 6 years for selected conditions, precautions do apply. Overdosage: If you think you have taken too much of this medicine contact a poison control center or emergency room at once. NOTE: This medicine is only for you. Do not share this medicine with others. What if I miss a dose? If you miss a dose, take it as soon as you can. If it is almost time for your next dose, take only that dose. Do not take double or extra doses. What may interact with this medicine? Do not take this medicine with any of the following medications: -cisapride -dofetilide -dronedarone -MAOIs like Carbex, Eldepryl, Marplan, Nardil, and Parnate -pimozide -reboxetine -thioridazine -ziprasidone This medicine may also interact with the following medications: -certain medicines for blood pressure, heart disease, irregular heart beat -certain medicines for depression, anxiety, or psychotic disturbances -certain medicines for lung disease like albuterol -cold or allergy medicines -fluoxetine -medicines that increase blood pressure like dopamine, dobutamine, or ephedrine -other medicines that prolong the QT interval (cause an abnormal heart rhythm) -paroxetine -quinidine -stimulant medicines for attention disorders, weight loss, or to stay awake This list may not describe all possible interactions. Give your health care provider a list of all the medicines, herbs, non-prescription drugs, or dietary supplements you use. Also tell them if you smoke, drink alcohol, or use illegal drugs. Some items may interact with your medicine. What should I watch for while using this medicine? It may take a  week or more for this medicine to take effect. This is why it is very important to continue taking the medicine and not miss any doses. If you have been taking this medicine regularly for some time, do not suddenly stop  taking it. Ask your doctor or health care professional for advice. Rarely, this medicine may increase thoughts of suicide or suicide attempts in children and teenagers. Call your child's health care professional right away if your child or teenager has new or increased thoughts of suicide or has changes in mood or behavior like becoming irritable or anxious. Regularly monitor your child for these behavioral changes. For males, contact you doctor or health care professional right away if you have an erection that lasts longer than 4 hours or if it becomes painful. This may be a sign of serious problem and must be treated right away to prevent permanent damage. You may get drowsy or dizzy. Do not drive, use machinery, or do anything that needs mental alertness until you know how this medicine affects you. Do not stand or sit up quickly, especially if you are an older patient. This reduces the risk of dizzy or fainting spells. Alcohol can make you more drowsy and dizzy. Avoid alcoholic drinks. Do not treat yourself for coughs, colds or allergies without asking your doctor or health care professional for advice. Some ingredients can increase possible side effects. Your mouth may get dry. Chewing sugarless gum or sucking hard candy, and drinking plenty of water will help. What side effects may I notice from receiving this medicine? Side effects that you should report to your doctor or health care professional as soon as possible: -allergic reactions like skin rash, itching or hives, swelling of the face, lips, or tongue -breathing problems -chest pain -dark urine -fast, irregular heartbeat -general ill feeling or flu-like symptoms -high blood pressure -males: prolonged or painful erection -stomach pain or tenderness -trouble passing urine or change in the amount of urine -vomiting -weight loss -yellowing of the eyes or skin Side effects that usually do not require medical attention (report to your  doctor or health care professional if they continue or are bothersome): -change in sex drive or performance -constipation or diarrhea -headache -loss of appetite -menstrual period irregularities -nausea -stomach upset This list may not describe all possible side effects. Call your doctor for medical advice about side effects. You may report side effects to FDA at 1-800-FDA-1088. Where should I keep my medicine? Keep out of the reach of children. Store at room temperature between 15 and 30 degrees C (59 and 86 degrees F). Throw away any unused medication after the expiration date. NOTE: This sheet is a summary. It may not cover all possible information. If you have questions about this medicine, talk to your doctor, pharmacist, or health care provider.  2018 Elsevier/Gold Standard (2014-01-20 15:29:22)   IF you received an x-ray today, you will receive an invoice from [facility]. Please contact [facility] at [phone] with questions or concerns regarding your invoice.   IF you received labwork today, you will receive an invoice from [facility]. Please contact [facility] at [phone] with questions or concerns regarding your invoice.   Our billing staff will not be able to assist you with questions regarding bills from these companies.  You will be contacted with the lab results as soon as they are available. The fastest way to get your results is to activate your My Chart account. Instructions are located on the last page of this  paperwork. If you have not heard from us regarding the results in 2 weeks, please contact this office.      

## 2017-09-25 ENCOUNTER — Ambulatory Visit: Payer: Managed Care, Other (non HMO) | Admitting: Physician Assistant

## 2017-09-25 ENCOUNTER — Encounter: Payer: Self-pay | Admitting: Physician Assistant

## 2017-09-25 ENCOUNTER — Other Ambulatory Visit: Payer: Self-pay

## 2017-09-25 DIAGNOSIS — R41840 Attention and concentration deficit: Secondary | ICD-10-CM

## 2017-09-25 MED ORDER — METHYLPHENIDATE HCL ER 27 MG PO TB24: 27.0000 mg | ORAL_TABLET | Freq: Every morning | ORAL | 0 refills | Status: DC

## 2017-09-25 MED ORDER — METHYLPHENIDATE HCL ER (OSM) 27 MG PO TBCR: 27.0000 mg | EXTENDED_RELEASE_TABLET | Freq: Every day | ORAL | 0 refills | Status: DC

## 2017-09-25 NOTE — Progress Notes (Signed)
Kathleen Lloyd  MRN: [id number] DOB: 24-Dec-1995  PCP: Patient, No Pcp Per  Subjective:  Pt is a 22 year old female who presents to clinic for f/u decreased concentration.   She was here 11/30 requesting something for depression, however after talking to her it sounded like she suffered from lack of motivation. She was started on Strattera 18mg . Made her shaky the first 3 days. She is taking this every morning. She has to take it after eating. "I can tell it's doing something, but doesn't feel like much".  She works at [employer] in [place]. Her productivity numbers at work are improving. She feels more motivated to get things done around her house.  Still no improvement at home with the relationship with her husband. They plan on starting counseling soon. She endorses lack of sex drive since her 24 year old was born.   Review of Systems  Gastrointestinal: Negative for constipation, nausea and vomiting.  Psychiatric/Behavioral: Positive for decreased concentration. Negative for self-injury and suicidal ideas. The patient is not nervous/anxious.     Patient Active Problem List   Diagnosis Date Noted  . Abnormal Pap smear of cervix 08/21/2017  . History of depression 08/21/2017  . Difficulty concentrating 08/21/2017    Current Outpatient Medications on File Prior to Visit  Medication Sig Dispense Refill  . LORYNA 3-0.02 MG tablet TK 1 T PO QD  2  . methylphenidate 18 MG PO CR tablet Take 1 tablet (18 mg total) by mouth every morning. 30 tablet 0  . amoxicillin-clavulanate (AUGMENTIN) 875-125 MG tablet TK 1 T PO BID  0  . azithromycin (ZITHROMAX) 250 MG tablet TK UTD FOR 5 DAYS  0  . benzonatate (TESSALON) 100 MG capsule TK 2 CS PO TID  0  . cyclobenzaprine (FLEXERIL) 10 MG tablet TK 1/2 TO 1 T PO BID PRN  0  . fluconazole (DIFLUCAN) 150 MG tablet   0  . fluticasone (FLONASE) 50 MCG/ACT nasal spray SHAKE LQ AND U 1 SPR IEN QD  0  . HYDROcodone-acetaminophen (NORCO/VICODIN) 5-325  MG tablet TK 1 TO 2 TS PO Q 6 H PRN P  0  . ibuprofen (ADVIL,MOTRIN) 800 MG tablet TK 1 T PO TID  0  . ondansetron (ZOFRAN-ODT) 4 MG disintegrating tablet DISSOLVE 1 T PO Q 8 H PRN NV  0  . PROAIR HFA 108 (90 Base) MCG/ACT inhaler INHALE 1 TO 2 PUFFS PO Q 4 TO 6 H PRN COU OR SOB  0  . promethazine-dextromethorphan (PROMETHAZINE-DM) 6.25-15 MG/5ML syrup TK 5 ML PO Q 6 H PRN  0   No current facility-administered medications on file prior to visit.     No Known Allergies   Objective:  BP 104/70   Pulse (!) 111   Temp 98.8 F (37.1 C) (Oral)   Resp 18   Ht 5' 2.5" (1.588 m)   Wt 190 lb 9.6 oz (86.5 kg)   LMP 08/10/2017   SpO2 97%   BMI 34.31 kg/m   Physical Exam  Constitutional: She is oriented to person, place, and time and well-developed, well-nourished, and in no distress. No distress.  Neurological: She is alert and oriented to person, place, and time. GCS score is 15.  Skin: Skin is warm and dry.  Psychiatric: Mood, memory, affect and judgment normal.  Vitals reviewed.   Assessment and Plan :  1. Difficulty concentrating - methylphenidate (CONCERTA) 27 MG CR tablet; Take 1 tablet (27 mg total) by mouth daily.  Dispense: 30 tablet; Refill: 0 - Plan to increase dose. RTC in 4-6 weeks for f/u.   Mercer Pod, PA-C  [facility] 09/25/2017 9:21 AM

## 2017-09-25 NOTE — Patient Instructions (Addendum)
Come back and see me in 4-6 weeks for recheck.   Thank you for coming in today. I hope you feel we met your needs.  Feel free to call PCP if you have any questions or further requests.  Please consider signing up for MyChart if you do not already have it, as this is a great way to communicate with me.  Best,  Whitney McVey, PA-C   IF you received an x-ray today, you will receive an invoice from [facility]. Please contact [facility] at [phone] with questions or concerns regarding your invoice.   IF you received labwork today, you will receive an invoice from [facility]. Please contact [facility] at [phone] with questions or concerns regarding your invoice.   Our billing staff will not be able to assist you with questions regarding bills from these companies.  You will be contacted with the lab results as soon as they are available. The fastest way to get your results is to activate your My Chart account. Instructions are located on the last page of this paperwork. If you have not heard from [place] regarding the results in 2 weeks, please contact this office.

## 2017-09-29 ENCOUNTER — Ambulatory Visit: Payer: Managed Care, Other (non HMO) | Admitting: Physician Assistant

## 2017-10-27 ENCOUNTER — Ambulatory Visit: Payer: Self-pay | Admitting: Physician Assistant

## 2017-10-28 ENCOUNTER — Ambulatory Visit: Payer: Managed Care, Other (non HMO) | Admitting: Physician Assistant

## 2017-11-20 ENCOUNTER — Ambulatory Visit: Payer: Self-pay | Admitting: Physician Assistant

## 2017-11-25 ENCOUNTER — Ambulatory Visit: Payer: Managed Care, Other (non HMO) | Admitting: Physician Assistant

## 2017-11-25 ENCOUNTER — Encounter: Payer: Self-pay | Admitting: Physician Assistant

## 2017-11-25 DIAGNOSIS — R41840 Attention and concentration deficit: Secondary | ICD-10-CM

## 2017-11-25 MED ORDER — METHYLPHENIDATE HCL ER (OSM) 36 MG PO TBCR: 36.0000 mg | EXTENDED_RELEASE_TABLET | Freq: Every day | ORAL | 0 refills | Status: AC

## 2017-11-25 MED ORDER — METHYLPHENIDATE HCL ER 36 MG PO TB24: 36.0000 mg | ORAL_TABLET | Freq: Every morning | ORAL | 0 refills | Status: AC

## 2017-11-25 NOTE — Patient Instructions (Addendum)
  Start taking '36mg'$  Concerta.  Send me a MyChart message in 4-6 weeks and let me know if this dose if working for you. If not, I will send in a higher dose.   Thank you for coming in today. I hope you feel we met your needs.  Feel free to call PCP if you have any questions or further requests.  Please consider signing up for MyChart if you do not already have it, as this is a great way to communicate with me.  Best,  Whitney McVey, PA-C   IF you received an x-ray today, you will receive an invoice from [facility]. Please contact [facility] at [phone] with questions or concerns regarding your invoice.   IF you received labwork today, you will receive an invoice from [facility]. Please contact [facility] at [phone] with questions or concerns regarding your invoice.   Our billing staff will not be able to assist you with questions regarding bills from these companies.  You will be contacted with the lab results as soon as they are available. The fastest way to get your results is to activate your My Chart account. Instructions are located on the last page of this paperwork. If you have not heard from [place] regarding the results in 2 weeks, please contact this office.

## 2017-11-25 NOTE — Progress Notes (Signed)
   Kathleen Lloyd  MRN: [id number] DOB: 03-Dec-1995  PCP: Patient, No Pcp Per  Subjective:  Pt is a 22 year old female who presents to clinic for f/u Concerta. See note from 1/4  Concerta 27 mg. Has not noticed a difference.   She just got hired on full time with [employer]  Review of Systems  Gastrointestinal: Negative for abdominal pain, nausea and vomiting.  Psychiatric/Behavioral: Positive for decreased concentration. Negative for self-injury and suicidal ideas. The patient is not nervous/anxious.     Patient Active Problem List   Diagnosis Date Noted  . Abnormal Pap smear of cervix 08/21/2017  . History of depression 08/21/2017  . Difficulty concentrating 08/21/2017    Current Outpatient Medications on File Prior to Visit  Medication Sig Dispense Refill  . LORYNA 3-0.02 MG tablet TK 1 T PO QD  2  . methylphenidate (CONCERTA) 27 MG CR tablet Take 1 tablet (27 mg total) by mouth daily. 30 tablet 0  . cyclobenzaprine (FLEXERIL) 10 MG tablet TK 1/2 TO 1 T PO BID PRN  0  . fluticasone (FLONASE) 50 MCG/ACT nasal spray SHAKE LQ AND U 1 SPR IEN QD  0  . methylphenidate 27 MG PO TB24 Take 1 tablet (27 mg total) by mouth every morning. 30 tablet 0  . PROAIR HFA 108 (90 Base) MCG/ACT inhaler INHALE 1 TO 2 PUFFS PO Q 4 TO 6 H PRN COU OR SOB  0   No current facility-administered medications on file prior to visit.     No Known Allergies   Objective:  BP 119/83   Pulse 91   Temp 98.3 F (36.8 C) (Oral)   Resp 16   Ht 5' 2.5" (1.588 m)   Wt 198 lb 6.4 oz (90 kg)   LMP 11/19/2017   SpO2 98%   BMI 35.71 kg/m   Physical Exam  Constitutional: She is oriented to person, place, and time and well-developed, well-nourished, and in no distress. No distress.  Cardiovascular: Normal rate, regular rhythm and normal heart sounds.  Neurological: She is alert and oriented to person, place, and time. GCS score is 15.  Skin: Skin is warm and dry.  Psychiatric: Mood, memory, affect and  judgment normal.  Vitals reviewed.   Assessment and Plan :  1. Difficulty concentrating - methylphenidate 36 MG PO CR tablet; Take 1 tablet (36 mg total) by mouth daily.  Dispense: 30 tablet; Refill: 0 - methylphenidate 36 MG PO CR tablet; Take 1 tablet (36 mg total) by mouth every morning.  Dispense: 30 tablet; Refill: 0 - Increase dose to 36mg  Concerta. Send MyChart message in 4-6 weeks if dose is improving sypmtoms. If not, okay to send in a higher dose.    Mercer Pod, PA-C  [facility] [place] 11/25/2017 3:49 PM

## 2019-02-03 ENCOUNTER — Encounter: Payer: Self-pay | Admitting: Family Medicine

## 2019-02-03 ENCOUNTER — Ambulatory Visit: Payer: Managed Care, Other (non HMO) | Admitting: Family Medicine

## 2019-02-03 ENCOUNTER — Other Ambulatory Visit: Payer: Self-pay

## 2019-02-03 VITALS — BP 120/74 | HR 80 | Temp 99.6°F | Ht 63.0 in | Wt 189.0 lb

## 2019-02-03 DIAGNOSIS — F900 Attention-deficit hyperactivity disorder, predominantly inattentive type: Secondary | ICD-10-CM | POA: Diagnosis not present

## 2019-02-03 MED ORDER — METHYLPHENIDATE HCL ER (OSM) 36 MG PO TBCR: 36.0000 mg | EXTENDED_RELEASE_TABLET | Freq: Every day | ORAL | 0 refills | Status: AC

## 2019-02-03 NOTE — Patient Instructions (Signed)
° ° ° °  If you have lab work done today you will be contacted with your lab results within the next 2 weeks.  If you have not heard from us then please contact us. The fastest way to get your results is to register for My Chart. ° ° °IF you received an x-ray today, you will receive an invoice from [facility]. Please contact [facility] at [phone] with questions or concerns regarding your invoice.  ° °IF you received labwork today, you will receive an invoice from [facility]. Please contact [facility] at [phone] with questions or concerns regarding your invoice.  ° °Our billing staff will not be able to assist you with questions regarding bills from these companies. ° °You will be contacted with the lab results as soon as they are available. The fastest way to get your results is to activate your My Chart account. Instructions are located on the last page of this paperwork. If you have not heard from us regarding the results in 2 weeks, please contact this office. °  ° ° ° °

## 2019-02-03 NOTE — Progress Notes (Signed)
Acute Office Visit  Subjective:    Patient ID: Kathleen Lloyd, female    DOB: [birth year], 23 y.o.   MRN: [id number]  Chief Complaint  Patient presents with  . ADHD    medication magement     HPI Patient is in today for ADHD-pt diagnosed in HS when difficulty with attention noted. Pt with formal diagnosis with psy evaluation-mother did not want pt to take medication. Pt graduated but did not do well in HS. Pt moved in the grandparents and now lives with grandmother and her 7yo pt with new relationship -plans to move to [facility] this summer to be near family and new boy friend. Pt states father died of CA at 41 due to dipping tob. May have had ADHD but was never diagnosed. Mother with depression untreated. Pt with 1/2 brother with no h/o ADHD or mental illness-h/o of substance abuse. Pt states she tried Ritalin and Stratterna Concerta worked well but pt lost insurance and could not return for follow up. Pt states no hyper symptoms-"figgety" , difficulty completing tasks due to distraction.  Pt due to take [employer] test next month and is concerned without medication she will have difficulty.   Pt with h/o TIA due to BCP-no neuro evaluation. States evaluated in ER-residual memory concerns-worsening concentration. Pt with miscarriage  11/19 due to stress. Right sided weakness and slurred speech resolved  Past Medical History:  Diagnosis Date  . Depression   . UTI (lower urinary tract infection)   . Vaginal delivery 05/2014    Past Surgical History:  Procedure Laterality Date  . MASS EXCISION Right 07/25/2014   Procedure: REMOVAL OF RIGHT AXILLA MASS;  Surgeon: Michael Boston, MD;  Location: [facility];  Service: General;  Laterality: Right;    Family History  Problem Relation Age of Onset  . Cancer Father        melanoma  . Stroke Paternal Aunt   . Hypertension Paternal Grandmother   . Cancer Paternal Grandmother     Social History   Socioeconomic History  . Marital status: Single   Spouse name: Not on file  . Number of children: Not on file  . Years of education: Not on file  . Highest education level: Not on file  Occupational History  . Not on file  Social Needs  . Financial resource strain: Not on file  . Food insecurity:    Worry: Not on file    Inability: Not on file  . Transportation needs:    Medical: Not on file    Non-medical: Not on file  Tobacco Use  . Smoking status: Never Smoker  . Smokeless tobacco: Never Used  Substance and Sexual Activity  . Alcohol use: No    Comment: occ  . Drug use: No  . Sexual activity: Yes    Birth control/protection: Injection  Lifestyle  . Physical activity:    Days per week: Not on file    Minutes per session: Not on file  . Stress: Not on file  Relationships  . Social connections:    Talks on phone: Not on file    Gets together: Not on file    Attends religious service: Not on file    Active member of club or organization: Not on file    Attends meetings of clubs or organizations: Not on file    Relationship status: Not on file  . Intimate partner violence:    Fear of current or ex partner: Not on file  Emotionally abused: Not on file    Physically abused: Not on file    Forced sexual activity: Not on file  Other Topics Concern  . Not on file  Social History Narrative  . Not on file    Outpatient Medications Prior to Visit  Medication Sig Dispense Refill  . cyclobenzaprine (FLEXERIL) 10 MG tablet TK 1/2 TO 1 T PO BID PRN  0  . fluticasone (FLONASE) 50 MCG/ACT nasal spray SHAKE LQ AND U 1 SPR IEN QD  0  . LORYNA 3-0.02 MG tablet TK 1 T PO QD  2  . methylphenidate 36 MG PO CR tablet Take 1 tablet (36 mg total) by mouth daily. 30 tablet 0  . methylphenidate 36 MG PO CR tablet Take 1 tablet (36 mg total) by mouth every morning. 30 tablet 0  . PROAIR HFA 108 (90 Base) MCG/ACT inhaler INHALE 1 TO 2 PUFFS PO Q 4 TO 6 H PRN COU OR SOB  0   No facility-administered medications prior to visit.      Allergies  Allergen Reactions  . Amoxicillin Hives    On chest   . Penicillins     hives    ROS CONSTITUTIONAL: no weight loss,or  fever, EENT:no  sinus problems, nasal congestion, CV: no chest pain RESP: no SOB, no cough, production, oxygen at home, coughing up blood or prior abnormal chest xray GI: no heartburn, or bowel changes GU: no pain with urination, NEURO: TIA-likely hormone mediated PSY:  ADHD       Objective:    Physical Exam  Constitutional: She is oriented to person, place, and time. She appears well-developed and well-nourished.  HENT:  Head: Atraumatic.  Eyes: Conjunctivae are normal.  Neck: Normal range of motion.  Cardiovascular: Normal rate, regular rhythm and normal heart sounds.  Pulmonary/Chest: Breath sounds normal.  Neurological: She is alert and oriented to person, place, and time.  Psychiatric: She has a normal mood and affect. Her behavior is normal.    BP 120/74 (BP Location: Right Arm, Patient Position: Sitting, Cuff Size: Normal)   Pulse 80   Temp 99.6 F (37.6 C) (Oral)   Ht 5\' 3"  (1.6 m)   Wt 189 lb (85.7 kg)   LMP 02/01/2019   SpO2 96%   BMI 33.48 kg/m  Wt Readings from Last 3 Encounters:  02/03/19 189 lb (85.7 kg)  11/25/17 198 lb 6.4 oz (90 kg)  09/25/17 190 lb 9.6 oz (86.5 kg)    Health Maintenance Due  Topic Date Due  . TETANUS/TDAP  04/23/2013  . PAP-Cervical Cytology Screening  04/24/2015  . PAP SMEAR-Modifier  04/24/2015      Lab Results  Component Value Date   TSH 0.913 02/07/2016   Lab Results  Component Value Date   WBC 9.1 02/07/2017   HGB 12.1 02/07/2017   HCT 37.1 02/07/2017   MCV 88.3 02/07/2017   PLT 258 02/07/2017   Lab Results  Component Value Date   NA 138 02/07/2017   K 3.6 02/07/2017   CO2 22 02/07/2017   GLUCOSE 91 02/07/2017   BUN 17 02/07/2017   CREATININE 0.63 02/07/2017   BILITOT 0.7 02/07/2017   ALKPHOS 59 02/07/2017   AST 23 02/07/2017   ALT 19 02/07/2017   PROT 7.3  02/07/2017   ALBUMIN 3.9 02/07/2017   CALCIUM 9.5 02/07/2017   ANIONGAP 11 02/07/2017     Assessment & Plan:   1. Attention deficit hyperactivity disorder (ADHD), predominantly inattentive type concerta-rx-f/u 1  month to discuss medication restart-pt understands controlled substance-side effects PT TO MAKE APPT WITH NEUROLOGY-discussed importance-concern for no contraception, no heme work up and no neuro re-evaluation post TIA- Kayona Foor Hannah Beat, MD

## 2019-02-22 ENCOUNTER — Ambulatory Visit: Payer: Managed Care, Other (non HMO) | Admitting: Family Medicine
